# Patient Record
Sex: Female | Born: 2000
Health system: Southern US, Community
[De-identification: ages and names within clinical notes are randomized; demographics above are authoritative.]

## PROBLEM LIST (undated history)

## (undated) DIAGNOSIS — F32A Depression, unspecified: Secondary | ICD-10-CM

## (undated) DIAGNOSIS — S86899A Other injury of other muscle(s) and tendon(s) at lower leg level, unspecified leg, initial encounter: Secondary | ICD-10-CM

## (undated) DIAGNOSIS — U071 COVID-19: Secondary | ICD-10-CM

## (undated) DIAGNOSIS — F509 Eating disorder, unspecified: Secondary | ICD-10-CM

## (undated) DIAGNOSIS — H9190 Unspecified hearing loss, unspecified ear: Secondary | ICD-10-CM

## (undated) HISTORY — DX: Eating disorder, unspecified: F50.9

## (undated) HISTORY — DX: Depression, unspecified: F32.A

## (undated) HISTORY — DX: Unspecified hearing loss, unspecified ear: H91.90

## (undated) HISTORY — PX: OTHER SURGICAL HISTORY: SHX169

## (undated) HISTORY — PX: TYMPANOSTOMY TUBE PLACEMENT: SHX32

## (undated) HISTORY — DX: COVID-19: U07.1

## (undated) HISTORY — DX: Other injury of other muscle(s) and tendon(s) at lower leg level, unspecified leg, initial encounter: S86.899A

---
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
## 2020-03-18 DIAGNOSIS — M79662 Pain in left lower leg: Secondary | ICD-10-CM | POA: Diagnosis not present

## 2020-03-18 DIAGNOSIS — M79661 Pain in right lower leg: Secondary | ICD-10-CM | POA: Diagnosis not present

## 2020-04-18 DIAGNOSIS — M79661 Pain in right lower leg: Secondary | ICD-10-CM | POA: Diagnosis not present

## 2020-04-18 DIAGNOSIS — M79662 Pain in left lower leg: Secondary | ICD-10-CM | POA: Diagnosis not present

## 2020-04-24 ENCOUNTER — Ambulatory Visit
Admission: RE | Admit: 2020-04-24 | Discharge: 2020-04-24 | Disposition: A | Discharge status: Home / Self Care | Payer: 59 | Source: Ambulatory Visit | Attending: Internal Medicine | Admitting: Internal Medicine

## 2020-04-24 VITALS — BP 105/69 | HR 80 | Temp 99.2°F | Resp 14 | Ht 65.0 in | Wt 150.0 lb

## 2020-04-24 DIAGNOSIS — J0100 Acute maxillary sinusitis, unspecified: Secondary | ICD-10-CM | POA: Diagnosis not present

## 2020-04-24 MED ORDER — AMOXICILLIN-POT CLAVULANATE 875-125 MG PO TABS
1.0000 | ORAL_TABLET | Freq: Two times a day (BID) | ORAL | 0 refills | Status: DC
Start: 2020-04-24 — End: 2020-05-16

## 2020-04-24 NOTE — ED Provider Notes (Signed)
Renaldo Fiddler    CSN: [id number] Arrival date & time: 04/24/20  7741      History   Chief Complaint Chief Complaint  Patient presents with  . Cough  . Nasal Congestion  . Ear Fullness    HPI Jaclyn Reyes is a 19 y.o. female.   Patient is a 19 year old female who presents today with approximate 10 days of nasal congestion, cough.  Had a taken Sudafed.  No known Covid exposures has been vaccinated.  Low-grade fever today.     History reviewed. No pertinent past medical history.  There are no problems to display for this patient.   History reviewed. No pertinent surgical history.  OB History   No obstetric history on file.      Home Medications    Prior to Admission medications   Medication Sig Start Date End Date Taking? Authorizing Provider  amoxicillin-clavulanate (AUGMENTIN) 875-125 MG tablet Take 1 tablet by mouth every 12 (twelve) hours. 04/24/20   Dahlia Byes A, NP  pseudoephedrine (SUDAFED) 30 MG tablet Take 30 mg by mouth every 4 (four) hours as needed for congestion.    [provider]    Family History Family History  Problem Relation Age of Onset  . Healthy Mother   . Healthy Father     Social History Social History   Tobacco Use  . Smoking status: Never Smoker  . Smokeless tobacco: Never Used  Vaping Use  . Vaping Use: Never used  Substance Use Topics  . Alcohol use: Not Currently  . Drug use: Not Currently     Allergies   Patient has no known allergies.   Review of Systems Review of Systems   Physical Exam Triage Vital Signs ED Triage Vitals  Enc Vitals Group     BP 04/24/20 0928 105/69     Pulse Rate 04/24/20 0928 80     Resp 04/24/20 0928 14     Temp 04/24/20 0928 99.2 F (37.3 C)     Temp Source 04/24/20 0928 Oral     SpO2 04/24/20 0928 98 %     Weight 04/24/20 0924 150 lb (68 kg)     Height 04/24/20 0924 5\' 5"  (1.651 m)     Head Circumference --      Peak Flow --      Pain Score 04/24/20  0923 7     Pain Loc --      Pain Edu? --      Excl. in GC? --    No data found.  Updated Vital Signs BP 105/69 (BP Location: Left Arm)   Pulse 80   Temp 99.2 F (37.3 C) (Oral)   Resp 14   Ht 5\' 5"  (1.651 m)   Wt 150 lb (68 kg)   LMP 03/19/2020 (Exact Date)   SpO2 98%   BMI 24.96 kg/m   Visual Acuity Right Eye Distance:   Left Eye Distance:   Bilateral Distance:    Right Eye Near:   Left Eye Near:    Bilateral Near:     Physical Exam Vitals and nursing note reviewed.  Constitutional:      General: She is not in acute distress.    Appearance: Normal appearance. She is not ill-appearing, toxic-appearing or diaphoretic.  HENT:     Head: Normocephalic.     Right Ear: Tympanic membrane and ear canal normal.     Left Ear: Tympanic membrane and ear canal normal.     Nose: Congestion and  rhinorrhea present.     Mouth/Throat:     Pharynx: Oropharynx is clear. Posterior oropharyngeal erythema present.  Eyes:     Conjunctiva/sclera: Conjunctivae normal.  Cardiovascular:     Rate and Rhythm: Normal rate and regular rhythm.  Pulmonary:     Effort: Pulmonary effort is normal.     Breath sounds: Normal breath sounds.  Musculoskeletal:        General: Normal range of motion.     Cervical back: Normal range of motion.  Skin:    General: Skin is warm and dry.     Findings: No rash.  Neurological:     Mental Status: She is alert.  Psychiatric:        Mood and Affect: Mood normal.      UC Treatments / Results  Labs (all labs ordered are listed, but only abnormal results are displayed) Labs Reviewed - No data to display  EKG   Radiology No results found.  Procedures Procedures (including critical care time)  Medications Ordered in UC Medications - No data to display  Initial Impression / Assessment and Plan / UC Course  I have reviewed the triage vital signs and the nursing notes.  Pertinent labs & imaging results that were available during my care of the  patient were reviewed by me and considered in my medical decision making (see chart for details).     Sinusitis Patient with 10+ days of symptoms.  We will go and treat for bacterial sinusitis at this time. Treating with Augmentin.  She can continue the over-the-counter medicines as needed. Follow up as needed for continued or worsening symptoms  Final Clinical Impressions(s) / UC Diagnoses   Final diagnoses:  Acute non-recurrent maxillary sinusitis     Discharge Instructions     Treating you for a sinus infection.  Take the medication as prescribed.  You can continue the over-the-counter medicine as needed Follow up as needed for continued or worsening symptoms     ED Prescriptions    Medication Sig Dispense Auth. Provider   amoxicillin-clavulanate (AUGMENTIN) 875-125 MG tablet Take 1 tablet by mouth every 12 (twelve) hours. 14 tablet Jalin Alicea A, NP     PDMP not reviewed this encounter.   Janace Aris, NP 04/24/20 [phone]

## 2020-04-24 NOTE — ED Triage Notes (Signed)
Pt presents to Urgent Care with c/o nasal congestion and cough x approx 10 days. Pt w/ no known COVID exposure; has been vaccinated.

## 2020-04-24 NOTE — Discharge Instructions (Signed)
Treating you for a sinus infection.  Take the medication as prescribed.  You can continue the over-the-counter medicine as needed Follow up as needed for continued or worsening symptoms

## 2020-05-01 ENCOUNTER — Encounter: Payer: BLUE CROSS/BLUE SHIELD | Admitting: Family Medicine

## 2020-05-16 ENCOUNTER — Other Ambulatory Visit: Payer: Self-pay

## 2020-05-16 ENCOUNTER — Encounter: Payer: Self-pay | Admitting: Family Medicine

## 2020-05-16 ENCOUNTER — Other Ambulatory Visit (HOSPITAL_COMMUNITY)
Admission: RE | Admit: 2020-05-16 | Discharge: 2020-05-16 | Disposition: A | Discharge status: Home / Self Care | Payer: 59 | Source: Ambulatory Visit | Attending: Family Medicine | Admitting: Family Medicine

## 2020-05-16 ENCOUNTER — Ambulatory Visit (INDEPENDENT_AMBULATORY_CARE_PROVIDER_SITE_OTHER): Payer: 59 | Admitting: Family Medicine

## 2020-05-16 VITALS — BP 115/74 | HR 84 | Ht 65.0 in | Wt 167.0 lb

## 2020-05-16 DIAGNOSIS — Z01419 Encounter for gynecological examination (general) (routine) without abnormal findings: Secondary | ICD-10-CM | POA: Diagnosis not present

## 2020-05-16 DIAGNOSIS — Z113 Encounter for screening for infections with a predominantly sexual mode of transmission: Secondary | ICD-10-CM | POA: Insufficient documentation

## 2020-05-16 DIAGNOSIS — Z3041 Encounter for surveillance of contraceptive pills: Secondary | ICD-10-CM | POA: Diagnosis not present

## 2020-05-16 MED ORDER — NORGESTIMATE-ETH ESTRADIOL 0.25-35 MG-MCG PO TABS: 1.0000 | ORAL_TABLET | Freq: Every day | ORAL | 3 refills | Status: DC

## 2020-05-16 NOTE — Progress Notes (Signed)
Was on sprintec in past, stopped in Nov 2021, pediatrician refused to refill RX for her.

## 2020-05-16 NOTE — Progress Notes (Signed)
    Subjective:    Patient ID: Jaclyn Reyes is a 20 y.o. female presenting with Contraception  on 05/16/2020  HPI: Patient is here for OC renewal and annual exam. Plays soccer in college, she has been on Sprintec and would like to continue.  I have reviewed and updated patient's PMH, PSH, Soc Hx, FH, allergies, meds.  Review of Systems  Constitutional: Negative for chills and fever.  Respiratory: Negative for shortness of breath.   Cardiovascular: Negative for chest pain.  Gastrointestinal: Negative for abdominal pain, nausea and vomiting.  Genitourinary: Negative for dysuria.  Skin: Negative for rash.      Objective:    BP 115/74   Pulse 84   Ht 5\' 5"  (1.651 m)   Wt 167 lb (75.8 kg)   LMP 04/30/2020 (Exact Date)   BMI 27.79 kg/m  Physical Exam Constitutional:      General: She is not in acute distress.    Appearance: She is well-developed and well-nourished.  HENT:     Head: Normocephalic and atraumatic.  Eyes:     General: No scleral icterus. Cardiovascular:     Rate and Rhythm: Normal rate.  Pulmonary:     Effort: Pulmonary effort is normal.  Abdominal:     Palpations: Abdomen is soft.  Musculoskeletal:     Cervical back: Neck supple.  Skin:    General: Skin is warm and dry.  Neurological:     Mental Status: She is alert and oriented to person, place, and time.  Psychiatric:        Mood and Affect: Mood and affect normal.         Assessment & Plan:   Problem List Items Addressed This Visit   None   Visit Diagnoses    Encounter for surveillance of contraceptive pills    -  Primary   resume sprintec, sunday start after next menses   Relevant Medications   norgestimate-ethinyl estradiol (ORTHO-CYCLEN) 0.25-35 MG-MCG tablet   Screen for STD (sexually transmitted disease)       Relevant Orders   GC/Chlamydia probe amp ([place])not at [facility]   Encounter for gynecological examination without abnormal finding       safe sex practices        Return in about 1 year (around 05/16/2021).  07/14/2021 05/16/2020 10:57 AM

## 2020-05-17 LAB — GC/CHLAMYDIA PROBE AMP (~~LOC~~) NOT AT ARMC
Chlamydia: NEGATIVE
Comment: NEGATIVE
Comment: NORMAL
Neisseria Gonorrhea: NEGATIVE

## 2020-09-18 ENCOUNTER — Other Ambulatory Visit: Payer: Self-pay

## 2020-09-18 ENCOUNTER — Ambulatory Visit: Payer: 59 | Attending: Pediatrics

## 2020-09-18 DIAGNOSIS — M6281 Muscle weakness (generalized): Secondary | ICD-10-CM | POA: Insufficient documentation

## 2020-09-18 DIAGNOSIS — M79662 Pain in left lower leg: Secondary | ICD-10-CM | POA: Insufficient documentation

## 2020-09-18 DIAGNOSIS — M79661 Pain in right lower leg: Secondary | ICD-10-CM | POA: Insufficient documentation

## 2020-09-18 NOTE — Therapy (Signed)
[facility] MAIN [facility] SERVICES [address] [place], [place], [address] Phone: [phone]   Fax:  [phone]  Patient Details  Name: Jaclyn Reyes MRN: [id number] Date of Birth: 30-Sep-2000 Referring Provider:  Chrys Racer, MD  Encounter Date: 09/18/2020   PT/OT/SLP Screening Form   Time: in 12: 58    Time out 1: 41pm   Complaint: bilateral shin splints  Past Medical Hx: history of bilateral ankle injury, shin splints Injury Date:began in 2020.  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 Her pain started in fall of 2020 for her freshman year of soccer but was minor, it than re-occurred in fall of 2021 and became problematic limiting her ability to play, walk, and perform physical activities. She has seen her athletic trainer for it as well as doctor but has not had relief. Is on summer break at this time but will start training in mid summer.   Worst pain 10/10 Least amount of pain 3-4/10  Assessment:  Ankle: excessive inversion bilaterally, hypombile eversion bilaterally with R>L. WFL df/pf  Knee: WFL ROM however has some "bowing in" indicating adductor weakness and need for single limb stability    Ankle ROM Seated: Motion: AROM seated Right Left  Plantarflexion 4+ 5  Dorsiflexion 4 4  Inversion 4 4  Eversion 4 4   Accessory motions: Talocrural: hypomobile AP bilaterally Medial/lateral glide: hypomobile   Soft tissue:  Limited Sartorious and gastrocnemius tissue length bilaterally  Tenderness to medial aspect of tibial border bilaterally    Recommendations:    Comments: Patient presents with bilateral shin splints. Her pain is limiting her return to sport at this time and will benefit from skilled physical therapy. A return to running protocol given as well as daily stretching program. Patient demonstrated understanding of HEP.   Additional education on cross training in swimming pool provided.  Patient taped with K tape, educated on taping technique and demonstrated understanding. The following HEP is provided below.   [email or web address]-content/uploads/[email or web address]/[email or web address]-Splints.pdf  []  Patient would benefit from an MD referral [x]  Patient would benefit from a full PT/OT/ SLP evaluation and treatment. []  No intervention recommended at this time.   , PT, DPT    09/18/2020, 3:42 PM  [facility] MAIN [facility] SERVICES [address] [place], [facility], [address] Phone: [phone]   Fax:  [phone]

## 2020-09-21 ENCOUNTER — Telehealth: Payer: Self-pay | Admitting: Family Medicine

## 2020-09-21 DIAGNOSIS — S86899A Other injury of other muscle(s) and tendon(s) at lower leg level, unspecified leg, initial encounter: Secondary | ICD-10-CM

## 2020-09-21 NOTE — Telephone Encounter (Signed)
Sports injury--refer to PT

## 2020-09-21 NOTE — Telephone Encounter (Signed)
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 You have also seen this patient, she needs a referral for PT for shin splints [facility] - outpatient rehab) patient plays college soccer and needs to has some PT) before returning to school. phone number for [facility] - outpt rehab is [phone]. Patient does not have PCP at this time.

## 2020-10-02 ENCOUNTER — Ambulatory Visit (LOCAL_COMMUNITY_HEALTH_CENTER): Payer: Self-pay

## 2020-10-02 ENCOUNTER — Other Ambulatory Visit: Payer: Self-pay

## 2020-10-02 DIAGNOSIS — Z111 Encounter for screening for respiratory tuberculosis: Secondary | ICD-10-CM

## 2020-10-04 ENCOUNTER — Other Ambulatory Visit: Payer: Self-pay

## 2020-10-04 ENCOUNTER — Ambulatory Visit: Payer: 59

## 2020-10-04 DIAGNOSIS — M79661 Pain in right lower leg: Secondary | ICD-10-CM

## 2020-10-04 DIAGNOSIS — M79662 Pain in left lower leg: Secondary | ICD-10-CM | POA: Diagnosis present

## 2020-10-04 DIAGNOSIS — M6281 Muscle weakness (generalized): Secondary | ICD-10-CM | POA: Diagnosis present

## 2020-10-04 NOTE — Therapy (Signed)
[place] [facility] MAIN [facility] SERVICES [address] [place], [place], [address] Phone: [phone]   Fax:  [phone]  Physical Therapy Evaluation  Patient Details  Name: Jaclyn Reyes MRN: [id number] Date of Birth: 2001-01-19 Referring Provider (PT): Tinnie Gens   Encounter Date: 10/04/2020   PT End of Session - 10/04/20 0836    Visit Number 1    Number of Visits 12    Date for PT Re-Evaluation 12/27/20    Authorization Type 1/10 eval 5/25    PT Start Time 0717    PT Stop Time 0813    PT Time Calculation (min) 56 min    Activity Tolerance Patient tolerated treatment well;Patient limited by pain    Behavior During Therapy [facility] for tasks assessed/performed           History reviewed. No pertinent past medical history.  Past Surgical History:  Procedure Laterality Date  . TYMPANOSTOMY TUBE PLACEMENT      There were no vitals filed for this visit.    Subjective Assessment - 10/04/20 0720    Subjective Patient is a pleasant 20 year old female who presents for shin splints.    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 Her pain started in fall of 2020 for her freshman year of soccer but was minor, it than re-occurred in fall of 2021 and became problematic limiting her ability to play, walk, and perform physical activities. She has seen her athletic trainer for it as well as doctor but has not had relief. Is on summer break at this time but will start training in mid summer    How long can you sit comfortably? bothers her in weightbearing and nonweightbearing, can't cross legs more than 5 minutes; hurts to have feet down for too long.    How long can you stand comfortably? can stand longer than sitting    How long can you walk comfortably? 20 minutes    Patient Stated Goals to decrease pain and return to soccer.    Currently in Pain? Yes    Pain Score 2     Pain Location Tibia    Pain Orientation Right;Left     Pain Descriptors / Indicators Aching;Pressure;Radiating    Pain Type Chronic pain    Pain Onset More than a month ago    Pain Frequency Constant    Aggravating Factors  soccer, first thing in the morning    Pain Relieving Factors n/a    Effect of Pain on Daily Activities limits soccer, painful with daily exercise              [facility] PT Assessment - 10/04/20 0831      Assessment   Medical Diagnosis medial tibial stress syndrome    Referring Provider (PT) Tinnie Gens    Onset Date/Surgical Date --   fall 2020   Prior Therapy yes from [facility] for this issue, and PT for ankles      Precautions   Precautions None      Restrictions   Weight Bearing Restrictions No      Balance Screen   Has the patient fallen in the past 6 months --   socer falls   Has the patient had a decrease in activity level because of a fear of falling?  Yes    Is the patient reluctant to leave their home because of a fear of falling?  No      Home Environment  Living Environment Private residence    Living Arrangements Parent   for summer   Available Help at Discharge Family      Prior Function   Level of Independence Independent    Vocation Student    Vocation Requirements plays soccer at school, waits tables, also in CNA course      Cognition   Overall Cognitive Status Within Functional Limits for tasks assessed      Observation/Other Assessments   Focus on Therapeutic Outcomes (FOTO)  67%              . SUBJECTIVE Chief complaint: bilateral shin pain in lateral aspect, numbness of lateral aspect of bilateral legs History: Her pain started in fall of 2020 for her freshman year of soccer but was minor, it than re-occurred in fall of 2021 and became problematic limiting her ability to play, walk, and perform physical activities. She has seen her athletic trainer for it as well as doctor but has not had relief. Is on summer break at this time but will start training in mid summer Referring  HQ:[id number] shin splints Referring Provider: Tinnie Gens  Pain location: bilateral shins  Pain: Present 2/10, Best 1/10, Worst 10 /10 Pain quality: pain quality: vague Radiating pain: Yes knees to feet Numbness/Tingling: Yes lateral aspect of leg to hip from foot; tingling on lateral aspect of calf and into feet.  24 hour pain behavior: worse after practice, can't walk up stairs  Aggravating factors: sitting, first getting up after bed, after playing soccer Easing factors: after warmed up nothing seems to help  History of back, hip, or knee pain: Yes ankles but not knees  Precautions/WB Restrictions: No Next MD Visit:  Follow-up appointment with MD: No Imaging: No  Occupational demands: is a soccer player at college, in class currently for CNA Hobbies: soccer, hang out with friends Goals: no pain with soccer Typical footwear: is aware of pain and shoe choice.  Yes   OBJECTIVE  MUSCULOSKELETAL: Tremor: Absent Bulk: Normal Tone: Normal, no clonus No trophic changes noted to foot/ankle. No ecchymosis, erythema, or edema noted. No gross ankle/foot deformity noted  Lumbar/Hip/Knee Screen SLR: negative Scour test: positive bilaterally All pelvic tests: negative FAIR: positive bilaterally Meniscal testing: +for L knee medial meniscus; negative R knee ACL, PCL, MCL, LCL testing negative Spinal: UPA CPA does not radiate pain Nerve glide: negative for pain but does relieve stiffness  Posture Excessive internal rotation of bilateral hips   Gait Pronation with ambulation; with internal hip rotation and knee "knocking"  Palpation Pain to bilateral shins  Strength R/L 5/5 Hip flexion 4/4 Hip external rotation 4/4 Hip internal rotation 4+/4+ Hip extension  5/4+ Hip abduction 4/4- Hip adduction 4/4 Knee extension: VMO 4/5;  5/5 Knee flexion 4+/5 Ankle Plantarflexion 4/4 Ankle Dorsiflexion 4+/4 Ankle Inversion 4/4 Ankle Eversion Full ROM for   AROM R/L 35/40 Ankle  Plantarflexion 10/5 Ankle Dorsiflexion 28/31 Ankle Inversion 24/25 Ankle Eversion *Indicates Pain  PROM R/L 40/42 Ankle Plantarflexion 12/6 Ankle Dorsiflexion 31/34 Ankle Inversion 24/23 Ankle Eversion *Indicates Pain  Passive Accessory Motion Superior Tibiofibular Joint: hypomobile bilateral ; painful Inferior Tibiofibular Joint: hypomobile painful  Talocrural Joint Distraction: hypomobile Talocrural Joint AP: hypomobile Talocrural Joint PA: hard end feel  NEUROLOGICAL:  Mental Status Patient is oriented to person, place and time.  Recent memory is intact.  Remote memory is intact.  Attention span and concentration are intact.  Expressive speech is intact.  Patient's fund of knowledge is within normal limits for  educational level.  Sensation Grossly intact to light touch bilateral LEs as determined by testing dermatomes L2-S2; lateral aspect of bilateral thighs, shins and feet impaired  Proprioception and hot/cold testing deferred on this date    SPECIAL TESTS  Ligamentous Integrity Anterior Drawer (ATF, 10-15 plantarflexion with anterior translation): Positive for history of sprain  Talar Tilt (CFL, inversion): Negative Eversion Stress Test (Deltoid, eversion): Positive for weakness and hx of strain External Rotation Test (High ankle, dorsiflexion and external rotation): Positive for pain in sin Squeeze Test (High ankle): Positive for pain in aspect of shin rather than calf  Impingment Sign (Dorsiflexion and eversion): Positive  Achilles Integrity Thompson Test: Negative  Fracture Screening Metatarsal Axial Loading: Negative Tap/Percussion Test: Negative Vibration Test: Positive pain in shin   Pronation/Supination Navicular Drop: Positive  Nerve Test Tarsal Tunnel Test (maximal DF, EV, toe ext with tapping over tarsal tunnel): Negative Test for Morton's Neuroma (compress metatarsals and mobilize): Not done  Other Windlass Mechanism Test: Not  done  Special functional test: airex pad: -static stand: limited ankle righting reactions with high episodic rocking -NBOS: increase challenge to ankle righting reactions: slight pain increase -NBOS with eyes closed: significant rocking and limited stabilization -modified tandem stance: no pain increase but slight pull   Squat:  Bilateral hip and knee internal rotation with foot collapse, cued for flexion of big toe and pushing out knees assists in neutralization of body mechanics  Lunge: Hip and knee excessive internal rotation: cue for flexion of big toe and abduction of knee assists in neutralization of body mechanics  SLS: 30 seconds bilaterally but increased trembling with LLE  ASSESSMENT Pt is a pleasant year-old 20 year old female referred for bilateral shin splints/medial stress syndrome.  PT examination reveals deficits in ankle strength and mobility as well as hip and knee instability. She has excessive hip and knee internal rotation as well as dropped arches bilaterally. Excessively tight posterior calves limit her functional range of motion. Her lateral calf and thighs have bilateral limited sensation that will additionally need to be addressed for patient function. Education on cross training in pool and need for stretching performed with patient verbalizing understanding. Due to her high level of function will benefit from 1x/week at this time however this is subject to change as patient nears season start. Pt will benefit from PT services to address deficits in strength, mobility, and pain in order to return to full function with activities and return to sport.      Access Code: [email or web address] URL: [email or web address] Date: 10/03/2020 Prepared by: Precious Bard  Exercises  . Standing Single Leg Ball Rolls - Forward Backward - 1 x daily - 7 x weekly - 2 sets - 10 reps - 5 hold . Standing Single Leg Ball Rolls - Side to Side - 1 x daily - 7 x weekly - 2 sets - 10  reps - 5 hold . Standing Soccer Ball Taps - 1 x daily - 7 x weekly - 2 sets - 10 reps - 5 hold . Seated Ankle Alphabet - 1 x daily - 7 x weekly - 2 sets - 10 reps - 5 hold . Soleus Stretch on Wall - 1 x daily - 7 x weekly - 2 sets - 2 reps - 30 hold . Gastroc Stretch on Wall - 1 x daily - 7 x weekly - 2 sets - 2 reps - 30 hold . Sidelying Hip Adduction - 1 x daily - 7 x weekly - 2 sets -  10 reps - 5 hold . Supine Bridge with Mini Swiss Ball Between Knees - 1 x daily - 7 x weekly - 2 sets - 10 reps - 5 hold    Objective measurements completed on examination: See above findings.               PT Education - 10/04/20 0836    Education Details HEP, body mechanics, goals    Person(s) Educated Patient    Methods Explanation;Demonstration;Tactile cues;Verbal cues;Handout    Comprehension Verbalized understanding;Returned demonstration;Verbal cues required;Tactile cues required            PT Short Term Goals - 10/04/20 0839      PT SHORT TERM GOAL #1   Title Patient will be independent in home exercise program to improve strength/mobility for better functional independence with ADLs.    Baseline 5/25: HEP given    Time 6    Period Weeks    Status New    Target Date 11/15/20             PT Long Term Goals - 10/04/20 0840      PT LONG TERM GOAL #1   Title Patient will increase FOTO score to equal to or greater than 82%    to demonstrate statistically significant improvement in mobility and quality of life.    Baseline 5/25: 62%    Time 12    Period Weeks    Status New    Target Date 12/27/20      PT LONG TERM GOAL #2   Title Patient will return to playing soccer with pain increase of less than 3/10 for return to sport.    Baseline 5/25: unable to play without 10/10 pain    Time 12    Period Weeks    Status New    Target Date 12/27/20      PT LONG TERM GOAL #3   Title Patient will increase BLE gross strength to 5/5 as to improve functional strength for  independent gait, increased standing tolerance and increased ADL ability.    Baseline 5/25: see note    Time 12    Period Weeks    Status New    Target Date 12/27/20      PT LONG TERM GOAL #4   Title Patient will squat and lunge with nuetral body mechanics without excessive internal rotation indicating improved alignment of LE's as well as strength.    Baseline 5/25: internal hip and knee collapse    Time 12    Period Weeks    Status New    Target Date 12/27/20      PT LONG TERM GOAL #5   Title Patient will report a worst pain of 3/10 on VAS in bilateral calves  to improve tolerance with ADLs and reduced symptoms with activities.    Baseline 5/25: 10/10    Time 12    Period Weeks    Status New    Target Date 12/27/20                  Plan - 10/04/20 0837    Clinical Impression Statement Pt is a pleasant year-old 20 year old female referred for bilateral shin splints/medial stress syndrome.  PT examination reveals deficits in ankle strength and mobility as well as hip and knee instability. She has excessive hip and knee internal rotation as well as dropped arches bilaterally. Excessively tight posterior calves limit her functional range of motion. Her lateral calf and  thighs have bilateral limited sensation that will additionally need to be addressed for patient function. Education on cross training in pool and need for stretching performed with patient verbalizing understanding. Due to her high level of function will benefit from 1x/week at this time however this is subject to change as patient nears season start. Pt will benefit from PT services to address deficits in strength, mobility, and pain in order to return to full function with activities and return to sport.    Personal Factors and Comorbidities Comorbidity 1;Past/Current Experience;Time since onset of injury/illness/exacerbation    Comorbidities history of ankle instability    Examination-Activity Limitations  Carry;Sleep;Sit;Locomotion Level;Lift;Squat;Stairs;Stand;Transfers    Examination-Participation Restrictions Cleaning;Community Activity;Driving;Occupation;Meal Prep;Laundry;School;Volunteer;Yard Work;Other    Stability/Clinical Decision Making Stable/Uncomplicated    Clinical Decision Making Low    Rehab Potential Good    PT Frequency 1x / week    PT Duration 12 weeks    PT Treatment/Interventions ADLs/Self Care Home Management;Aquatic Therapy;Biofeedback;Cryotherapy;Electrical Stimulation;Iontophoresis 4mg /ml Dexamethasone;Moist Heat;Ultrasound;Traction;Gait training;Stair training;Functional mobility training;Neuromuscular re-education;Balance training;Therapeutic exercise;Therapeutic activities;Patient/family education;Manual techniques;Dry needling;Passive range of motion;Scar mobilization;Compression bandaging;Energy conservation;Splinting;Taping;Visual/perceptual remediation/compensation;Spinal Manipulations;Joint Manipulations    PT Next Visit Plan single leg stability, calf restriction reduction, hip rotation    PT Home Exercise Plan see above    Consulted and Agree with Plan of Care Patient           Patient will benefit from skilled therapeutic intervention in order to improve the following deficits and impairments:  Abnormal gait,Decreased balance,Decreased coordination,Decreased mobility,Decreased range of motion,Difficulty walking,Decreased strength,Hypomobility,Increased edema,Impaired flexibility,Increased muscle spasms,Impaired sensation,Postural dysfunction,Improper body mechanics,Pain  Visit Diagnosis: Pain in left lower leg  Pain in right lower leg  Muscle weakness (generalized)     Problem List There are no problems to display for this patient.  , PT, DPT   10/04/2020, 8:44 AM  [place] [facility] MAIN [facility] SERVICES [address] [place], [place], [place] Phone: [phone]   Fax:  [phone]  Name: AUBRIELLA PEREZGARCIA MRN: [id number] Date of Birth: 07-13-2000

## 2020-10-04 NOTE — Patient Instructions (Signed)
  Access Code: [email or web address] URL: [email or web address] Date: 10/03/2020 Prepared by: Precious Bard  Exercises  . Standing Single Leg Ball Rolls - Forward Backward - 1 x daily - 7 x weekly - 2 sets - 10 reps - 5 hold . Standing Single Leg Ball Rolls - Side to Side - 1 x daily - 7 x weekly - 2 sets - 10 reps - 5 hold . Standing Soccer Ball Taps - 1 x daily - 7 x weekly - 2 sets - 10 reps - 5 hold . Seated Ankle Alphabet - 1 x daily - 7 x weekly - 2 sets - 10 reps - 5 hold . Soleus Stretch on Wall - 1 x daily - 7 x weekly - 2 sets - 2 reps - 30 hold . Gastroc Stretch on Wall - 1 x daily - 7 x weekly - 2 sets - 2 reps - 30 hold . Sidelying Hip Adduction - 1 x daily - 7 x weekly - 2 sets - 10 reps - 5 hold . Supine Bridge with Mini Swiss Ball Between Knees - 1 x daily - 7 x weekly - 2 sets - 10 reps - 5 hold

## 2020-10-05 ENCOUNTER — Ambulatory Visit (LOCAL_COMMUNITY_HEALTH_CENTER): Payer: 59

## 2020-10-05 ENCOUNTER — Other Ambulatory Visit: Payer: Self-pay

## 2020-10-05 DIAGNOSIS — Z111 Encounter for screening for respiratory tuberculosis: Secondary | ICD-10-CM

## 2020-10-05 DIAGNOSIS — Z3041 Encounter for surveillance of contraceptive pills: Secondary | ICD-10-CM

## 2020-10-05 LAB — TB SKIN TEST
Induration: 0 mm
TB Skin Test: NEGATIVE

## 2020-10-05 MED ORDER — NORGESTIMATE-ETH ESTRADIOL 0.25-35 MG-MCG PO TABS: 1.0000 | ORAL_TABLET | Freq: Every day | ORAL | 3 refills | Status: DC

## 2020-10-11 ENCOUNTER — Ambulatory Visit: Payer: 59 | Attending: Family Medicine

## 2020-10-11 ENCOUNTER — Other Ambulatory Visit: Payer: Self-pay

## 2020-10-11 DIAGNOSIS — M79661 Pain in right lower leg: Secondary | ICD-10-CM | POA: Insufficient documentation

## 2020-10-11 DIAGNOSIS — M79662 Pain in left lower leg: Secondary | ICD-10-CM | POA: Diagnosis present

## 2020-10-11 DIAGNOSIS — M6281 Muscle weakness (generalized): Secondary | ICD-10-CM | POA: Insufficient documentation

## 2020-10-11 NOTE — Therapy (Signed)
[facility] MAIN [facility] SERVICES [address] [place], [place], [address] Phone: [phone]   Fax:  [phone]  Physical Therapy Treatment  Patient Details  Name: Jaclyn Reyes MRN: [id number] Date of Birth: [birth year] Referring Provider (PT): Tinnie Gens   Encounter Date: 10/11/2020   PT End of Session - 10/11/20 0920    Visit Number 2    Number of Visits 12    Date for PT Re-Evaluation 12/27/20    Authorization Type 2/10 eval 5/25    PT Start Time 0717    PT Stop Time 0800    PT Time Calculation (min) 43 min    Activity Tolerance Patient tolerated treatment well    Behavior During Therapy [facility] for tasks assessed/performed           History reviewed. No pertinent past medical history.  Past Surgical History:  Procedure Laterality Date  . TYMPANOSTOMY TUBE PLACEMENT      There were no vitals filed for this visit.   Subjective Assessment - 10/11/20 0918    Subjective Patient reports her knee hurt when doing drills with her brother. No pain in shins though.    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 Her pain started in fall of 2020 for her freshman year of soccer but was minor, it than re-occurred in fall of 2021 and became problematic limiting her ability to play, walk, and perform physical activities. She has seen her athletic trainer for it as well as doctor but has not had relief. Is on summer break at this time but will start training in mid summer    How long can you sit comfortably? bothers her in weightbearing and nonweightbearing, can't cross legs more than 5 minutes; hurts to have feet down for too long.    How long can you stand comfortably? can stand longer than sitting    How long can you walk comfortably? 20 minutes    Patient Stated Goals to decrease pain and return to soccer.    Currently in Pain? No/denies               Manual Long sitting: heat applied to bilateral  calves Progressive dorsiflexion with focus on gastric and soleus lengthening 2x30 seconds each LE  Talocrural AP and PA mobilizations grade II-III x4 minutes Talocrural distraction 4x 20 second holds  Therex: On table:  Sidelying:  Side plank with dips 30 seconds each side   Quadruped:  Fire hydrants 15x each LE cues for gluteal activation rather than compensatory hip flexor contraction Planks 2x 30 seconds cues for neutral foot alignment   Seated: 4 way ankle resistance GTB each LE 10x each direction in long sitting Prostretch df/pf 20x Single limb sit to stand from raised plinth table pressing into PT hand for abduction 10x each LE dynadisc df/pf 10x each LE Dynadisc: clockwise circles 10x each LE; very challenging for patient  Standing: Against table: -modified single limb bridge with pressing into PT hand into abduction 10x each LE -modified pushup with opposite LE in extension 10x each LE.  Hedgehog taps single limb stability 8x each LE    Pt educated throughout session about proper posture and technique with exercises. Improved exercise technique, movement at target joints, use of target muscles after min to mod verbal, visual, tactile cues.  Patient is highly motivated throughout physical therapy session. Diffuse ankle instability noted bilaterally and will be a primary focus in today and future sessions. Single  limb stability is challenging for patient and patient requires external stimuli for stabilization. Pt will benefit from PT services to address deficits in strength, mobility, and pain in order to return to full function with activities and return to sport.                    PT Education - 10/11/20 0919    Education Details body mechanics, sequencing    Person(s) Educated Patient    Methods Explanation;Demonstration;Tactile cues;Verbal cues    Comprehension Verbalized understanding;Verbal cues required;Tactile cues required;Returned demonstration             PT Short Term Goals - 10/04/20 0839      PT SHORT TERM GOAL #1   Title Patient will be independent in home exercise program to improve strength/mobility for better functional independence with ADLs.    Baseline 5/25: HEP given    Time 6    Period Weeks    Status New    Target Date 11/15/20             PT Long Term Goals - 10/04/20 0840      PT LONG TERM GOAL #1   Title Patient will increase FOTO score to equal to or greater than 82%    to demonstrate statistically significant improvement in mobility and quality of life.    Baseline 5/25: 62%    Time 12    Period Weeks    Status New    Target Date 12/27/20      PT LONG TERM GOAL #2   Title Patient will return to playing soccer with pain increase of less than 3/10 for return to sport.    Baseline 5/25: unable to play without 10/10 pain    Time 12    Period Weeks    Status New    Target Date 12/27/20      PT LONG TERM GOAL #3   Title Patient will increase BLE gross strength to 5/5 as to improve functional strength for independent gait, increased standing tolerance and increased ADL ability.    Baseline 5/25: see note    Time 12    Period Weeks    Status New    Target Date 12/27/20      PT LONG TERM GOAL #4   Title Patient will squat and lunge with nuetral body mechanics without excessive internal rotation indicating improved alignment of LE's as well as strength.    Baseline 5/25: internal hip and knee collapse    Time 12    Period Weeks    Status New    Target Date 12/27/20      PT LONG TERM GOAL #5   Title Patient will report a worst pain of 3/10 on VAS in bilateral calves  to improve tolerance with ADLs and reduced symptoms with activities.    Baseline 5/25: 10/10    Time 12    Period Weeks    Status New    Target Date 12/27/20                 Plan - 10/11/20 6301    Clinical Impression Statement Patient is highly motivated throughout physical therapy session. Diffuse ankle instability  noted bilaterally and will be a primary focus in today and future sessions. Single limb stability is challenging for patient and patient requires external stimuli for stabilization. Pt will benefit from PT services to address deficits in strength, mobility, and pain in order to return to full function  with activities and return to sport.    Personal Factors and Comorbidities Comorbidity 1;Past/Current Experience;Time since onset of injury/illness/exacerbation    Comorbidities history of ankle instability    Examination-Activity Limitations Carry;Sleep;Sit;Locomotion Level;Lift;Squat;Stairs;Stand;Transfers    Examination-Participation Restrictions Cleaning;Community Activity;Driving;Occupation;Meal Prep;Laundry;School;Volunteer;Yard Work;Other    Stability/Clinical Decision Making Stable/Uncomplicated    Rehab Potential Good    PT Frequency 1x / week    PT Duration 12 weeks    PT Treatment/Interventions ADLs/Self Care Home Management;Aquatic Therapy;Biofeedback;Cryotherapy;Electrical Stimulation;Iontophoresis 4mg /ml Dexamethasone;Moist Heat;Ultrasound;Traction;Gait training;Stair training;Functional mobility training;Neuromuscular re-education;Balance training;Therapeutic exercise;Therapeutic activities;Patient/family education;Manual techniques;Dry needling;Passive range of motion;Scar mobilization;Compression bandaging;Energy conservation;Splinting;Taping;Visual/perceptual remediation/compensation;Spinal Manipulations;Joint Manipulations    PT Next Visit Plan single leg stability, calf restriction reduction, hip rotation    PT Home Exercise Plan see above    Consulted and Agree with Plan of Care Patient           Patient will benefit from skilled therapeutic intervention in order to improve the following deficits and impairments:  Abnormal gait,Decreased balance,Decreased coordination,Decreased mobility,Decreased range of motion,Difficulty walking,Decreased strength,Hypomobility,Increased  edema,Impaired flexibility,Increased muscle spasms,Impaired sensation,Postural dysfunction,Improper body mechanics,Pain  Visit Diagnosis: Pain in left lower leg  Pain in right lower leg  Muscle weakness (generalized)     Problem List There are no problems to display for this patient.  , PT, DPT   10/11/2020, 9:22 AM  [place] [facility] MAIN [facility] SERVICES [address] [place], [place], [place] Phone: [phone]   Fax:  [phone]  Name: Jaclyn Reyes MRN: Ebbie Latus Date of Birth: [birth year]

## 2020-10-17 ENCOUNTER — Ambulatory Visit: Payer: 59 | Admitting: Physical Therapy

## 2020-10-18 ENCOUNTER — Other Ambulatory Visit: Payer: Self-pay

## 2020-10-18 ENCOUNTER — Ambulatory Visit: Payer: 59

## 2020-10-18 DIAGNOSIS — M79662 Pain in left lower leg: Secondary | ICD-10-CM

## 2020-10-18 DIAGNOSIS — M79661 Pain in right lower leg: Secondary | ICD-10-CM

## 2020-10-18 DIAGNOSIS — M6281 Muscle weakness (generalized): Secondary | ICD-10-CM

## 2020-10-18 NOTE — Therapy (Signed)
[facility] MAIN [facility] SERVICES [address] [place], [place], [address] Phone: [phone]   Fax:  [phone]  Physical Therapy Treatment  Patient Details  Name: Jaclyn Reyes MRN: [id number] Date of Birth: 02/18/2001 Referring Provider (PT): Tinnie Gens   Encounter Date: 10/18/2020   PT End of Session - 10/18/20 0740    Visit Number 3    Number of Visits 12    Date for PT Re-Evaluation 12/27/20    Authorization Type 3/10 eval 5/25    PT Start Time 0717    PT Stop Time 0759    PT Time Calculation (min) 42 min    Activity Tolerance Patient tolerated treatment well    Behavior During Therapy [facility] for tasks assessed/performed           History reviewed. No pertinent past medical history.  Past Surgical History:  Procedure Laterality Date  . TYMPANOSTOMY TUBE PLACEMENT      There were no vitals filed for this visit.   Subjective Assessment - 10/18/20 0735    Subjective Patient flew to [place] over the weekend. Is having increased numbness after flight.    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 Her pain started in fall of 2020 for her freshman year of soccer but was minor, it than re-occurred in fall of 2021 and became problematic limiting her ability to play, walk, and perform physical activities. She has seen her athletic trainer for it as well as doctor but has not had relief. Is on summer break at this time but will start training in mid summer    How long can you sit comfortably? bothers her in weightbearing and nonweightbearing, can't cross legs more than 5 minutes; hurts to have feet down for too long.    How long can you stand comfortably? can stand longer than sitting    How long can you walk comfortably? 20 minutes    Patient Stated Goals to decrease pain and return to soccer.    Currently in Pain? Yes    Pain Score 7     Pain Location Tibia    Pain Orientation Right;Left    Pain Descriptors  / Indicators Aching;Squeezing;Pressure    Pain Type Chronic pain    Pain Onset More than a month ago    Pain Frequency Constant                   Manual supine: heat applied to bilateral calves Progressive dorsiflexion with focus on gastric and soleus lengthening 2x30 seconds each LE  Talocrural AP and PA mobilizations grade II-III x4 minutes Talocrural distraction 4x 20 second holds  Prone: STM with metal tool for tissue correction bilateral calves x4 minutes  Therex:   Superset: x3 sets Exercise 1: Three way LAQ: inv, neutral, eversion 10x each direction; with 3lb ankle weight  Exercise 2: lateral modified jump/skiers with focus on stabilization and sequencing  Superset 2: x2 sets  Airex pad single limb stance throwing ball to PT x10 Posterior lunges 10x each LE     Pt educated throughout session about proper posture and technique with exercises. Improved exercise technique, movement at target joints, use of target muscles after min to mod verbal, visual, tactile cues.   Patient tolerates progressive strengthening and stabilization interventions. She is able to perform light kinesthetic movements and lateral jumping without pain increase. L gastroc is more impaired than R with diffuse scar tissue noted near achilles region.  Dynamic pertubation's will continue to be an area for progression for patient.  Pt will benefit from PT services to address deficits in strength, mobility, and pain in order to return to full function with activities and return to sport                    PT Education - 10/18/20 0739    Education Details body mechanics, sequencing    Person(s) Educated Patient    Methods Explanation;Demonstration;Tactile cues;Verbal cues    Comprehension Verbalized understanding;Returned demonstration;Verbal cues required;Tactile cues required            PT Short Term Goals - 10/04/20 0839      PT SHORT TERM GOAL #1   Title Patient will be  independent in home exercise program to improve strength/mobility for better functional independence with ADLs.    Baseline 5/25: HEP given    Time 6    Period Weeks    Status New    Target Date 11/15/20             PT Long Term Goals - 10/04/20 0840      PT LONG TERM GOAL #1   Title Patient will increase FOTO score to equal to or greater than 82%    to demonstrate statistically significant improvement in mobility and quality of life.    Baseline 5/25: 62%    Time 12    Period Weeks    Status New    Target Date 12/27/20      PT LONG TERM GOAL #2   Title Patient will return to playing soccer with pain increase of less than 3/10 for return to sport.    Baseline 5/25: unable to play without 10/10 pain    Time 12    Period Weeks    Status New    Target Date 12/27/20      PT LONG TERM GOAL #3   Title Patient will increase BLE gross strength to 5/5 as to improve functional strength for independent gait, increased standing tolerance and increased ADL ability.    Baseline 5/25: see note    Time 12    Period Weeks    Status New    Target Date 12/27/20      PT LONG TERM GOAL #4   Title Patient will squat and lunge with nuetral body mechanics without excessive internal rotation indicating improved alignment of LE's as well as strength.    Baseline 5/25: internal hip and knee collapse    Time 12    Period Weeks    Status New    Target Date 12/27/20      PT LONG TERM GOAL #5   Title Patient will report a worst pain of 3/10 on VAS in bilateral calves  to improve tolerance with ADLs and reduced symptoms with activities.    Baseline 5/25: 10/10    Time 12    Period Weeks    Status New    Target Date 12/27/20                 Plan - 10/18/20 0748    Clinical Impression Statement Patient tolerates progressive strengthening and stabilization interventions. She is able to perform light kinesthetic movements and lateral jumping without pain increase. L gastroc is more  impaired than R with diffuse scar tissue noted near achilles region. Dynamic pertubation's will continue to be an area for progression for patient.  Pt will benefit from PT services to address deficits in  strength, mobility, and pain in order to return to full function with activities and return to sport    Personal Factors and Comorbidities Comorbidity 1;Past/Current Experience;Time since onset of injury/illness/exacerbation    Comorbidities history of ankle instability    Examination-Activity Limitations Carry;Sleep;Sit;Locomotion Level;Lift;Squat;Stairs;Stand;Transfers    Examination-Participation Restrictions Cleaning;Community Activity;Driving;Occupation;Meal Prep;Laundry;School;Volunteer;Yard Work;Other    Stability/Clinical Decision Making Stable/Uncomplicated    Rehab Potential Good    PT Frequency 1x / week    PT Duration 12 weeks    PT Treatment/Interventions ADLs/Self Care Home Management;Aquatic Therapy;Biofeedback;Cryotherapy;Electrical Stimulation;Iontophoresis 4mg /ml Dexamethasone;Moist Heat;Ultrasound;Traction;Gait training;Stair training;Functional mobility training;Neuromuscular re-education;Balance training;Therapeutic exercise;Therapeutic activities;Patient/family education;Manual techniques;Dry needling;Passive range of motion;Scar mobilization;Compression bandaging;Energy conservation;Splinting;Taping;Visual/perceptual remediation/compensation;Spinal Manipulations;Joint Manipulations    PT Next Visit Plan single leg stability, calf restriction reduction, hip rotation    PT Home Exercise Plan see above    Consulted and Agree with Plan of Care Patient           Patient will benefit from skilled therapeutic intervention in order to improve the following deficits and impairments:  Abnormal gait,Decreased balance,Decreased coordination,Decreased mobility,Decreased range of motion,Difficulty walking,Decreased strength,Hypomobility,Increased edema,Impaired flexibility,Increased  muscle spasms,Impaired sensation,Postural dysfunction,Improper body mechanics,Pain  Visit Diagnosis: Pain in left lower leg  Pain in right lower leg  Muscle weakness (generalized)     Problem List There are no problems to display for this patient.  , PT, DPT   10/18/2020, 8:00 AM  [place] [facility] MAIN [facility] SERVICES [address] [place], [place], [place] Phone: [phone]   Fax:  [phone]  Name: Jaclyn Reyes MRN: Ebbie Latus Date of Birth: [birth year]

## 2020-10-19 ENCOUNTER — Encounter: Payer: 59 | Admitting: Physical Therapy

## 2020-10-24 ENCOUNTER — Encounter: Payer: 59 | Admitting: Physical Therapy

## 2020-10-25 ENCOUNTER — Other Ambulatory Visit: Payer: Self-pay

## 2020-10-25 ENCOUNTER — Ambulatory Visit: Payer: 59

## 2020-10-25 DIAGNOSIS — M79662 Pain in left lower leg: Secondary | ICD-10-CM | POA: Diagnosis not present

## 2020-10-25 DIAGNOSIS — M79661 Pain in right lower leg: Secondary | ICD-10-CM

## 2020-10-25 DIAGNOSIS — M6281 Muscle weakness (generalized): Secondary | ICD-10-CM

## 2020-10-25 NOTE — Therapy (Signed)
[place] [facility] MAIN [facility] SERVICES [address] [place], [place], [address] Phone: [phone]   Fax:  [phone]  Physical Therapy Treatment  Patient Details  Name: Jaclyn Reyes MRN: [id number] Date of Birth: Apr 08, 2001 Referring Provider (PT): Tinnie Gens   Encounter Date: 10/25/2020   PT End of Session - 10/25/20 0738     Visit Number 4    Number of Visits 12    Date for PT Re-Evaluation 12/27/20    Authorization Type 4/10 eval 5/25    PT Start Time 0715    PT Stop Time 0759    PT Time Calculation (min) 44 min    Activity Tolerance Patient tolerated treatment well    Behavior During Therapy [facility] for tasks assessed/performed             History reviewed. No pertinent past medical history.  Past Surgical History:  Procedure Laterality Date   TYMPANOSTOMY TUBE PLACEMENT      There were no vitals filed for this visit.   Subjective Assessment - 10/25/20 0734     Subjective Patient reports she has been having discomfort after exercise since last visit. No pain after swimming though.    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 Her pain started in fall of 2020 for her freshman year of soccer but was minor, it than re-occurred in fall of 2021 and became problematic limiting her ability to play, walk, and perform physical activities. She has seen her athletic trainer for it as well as doctor but has not had relief. Is on summer break at this time but will start training in mid summer    How long can you sit comfortably? bothers her in weightbearing and nonweightbearing, can't cross legs more than 5 minutes; hurts to have feet down for too long.    How long can you stand comfortably? can stand longer than sitting    How long can you walk comfortably? 20 minutes    Patient Stated Goals to decrease pain and return to soccer.    Currently in Pain? Yes    Pain Score 6     Pain Location Tibia    Pain  Orientation Right;Left    Pain Descriptors / Indicators Discomfort    Pain Type Chronic pain    Pain Onset More than a month ago    Pain Frequency Intermittent                      Manual supine: heat applied to bilateral calves Progressive dorsiflexion with focus on gastric and soleus lengthening 2x30 seconds each LE  Talocrural AP and PA mobilizations grade II-III x4 minutes Talocrural distraction 4x 20 second holds  Prone: STM with metal tool for tissue correction bilateral calves x4 minutes   Therex:  great toe abduction ; very challenging for patient requires assistance for task performance Tilt board df stretch hold 30 seconds  Tilt board anterior/posterior shift 10x each LE placement  Superset:1 x3 sets Exercise 1: single limb squat 10x from raised plinth table ; cues to not hyperextend upon standing and to maintain outward direction of knee Exercise 2: monster walks BTB around ankles 20 ft forward/backwards    Superset 2: x2 sets Airex pad single limb stance three way hip raise 10x each LE Single leg heel raise 25 x each LE    Pt educated throughout session about proper posture and technique with exercises. Improved exercise technique, movement at  target joints, use of target muscles after min to mod verbal, visual, tactile cues.     Access Code: [email or web address] URL: [email or web address] Date: 10/25/2020 Prepared by: Precious Bard  Exercises Standing 3-Way Leg Reach with Resistance at Ankles and Counter Support - 1 x daily - 7 x weekly - 2 sets - 10 reps - 5 hold Single Leg Heel Raise with Counter Support - 1 x daily - 7 x weekly - 2 sets - 25 reps - 5 hold Toe Spreading - 1 x daily - 7 x weekly - 2 sets - 10 reps - 5 hold              PT Education - 10/25/20 0738     Education Details body mechanics, stabilization, not pushing past pain    Person(s) Educated Patient    Methods Explanation;Demonstration;Tactile cues;Verbal cues     Comprehension Verbalized understanding;Returned demonstration;Verbal cues required;Tactile cues required              PT Short Term Goals - 10/04/20 0839       PT SHORT TERM GOAL #1   Title Patient will be independent in home exercise program to improve strength/mobility for better functional independence with ADLs.    Baseline 5/25: HEP given    Time 6    Period Weeks    Status New    Target Date 11/15/20               PT Long Term Goals - 10/04/20 0840       PT LONG TERM GOAL #1   Title Patient will increase FOTO score to equal to or greater than 82%    to demonstrate statistically significant improvement in mobility and quality of life.    Baseline 5/25: 62%    Time 12    Period Weeks    Status New    Target Date 12/27/20      PT LONG TERM GOAL #2   Title Patient will return to playing soccer with pain increase of less than 3/10 for return to sport.    Baseline 5/25: unable to play without 10/10 pain    Time 12    Period Weeks    Status New    Target Date 12/27/20      PT LONG TERM GOAL #3   Title Patient will increase BLE gross strength to 5/5 as to improve functional strength for independent gait, increased standing tolerance and increased ADL ability.    Baseline 5/25: see note    Time 12    Period Weeks    Status New    Target Date 12/27/20      PT LONG TERM GOAL #4   Title Patient will squat and lunge with nuetral body mechanics without excessive internal rotation indicating improved alignment of LE's as well as strength.    Baseline 5/25: internal hip and knee collapse    Time 12    Period Weeks    Status New    Target Date 12/27/20      PT LONG TERM GOAL #5   Title Patient will report a worst pain of 3/10 on VAS in bilateral calves  to improve tolerance with ADLs and reduced symptoms with activities.    Baseline 5/25: 10/10    Time 12    Period Weeks    Status New    Target Date 12/27/20                   Plan -  10/25/20 0814      Clinical Impression Statement Patient tolerated progressive single limb stance interventions. Her calf activation continues to be limited as well as ankle righting reactions on stable and unstable surfaces indicating area for continued focus as well as adding HEP for task performance. Patient is unable to actively abduct toes requiring AAROM for task performance. Educated on importance of foot intrinsic muscle strengthening for ankle stabilization and pain reduction with patient verbalizing understanding. Pt will benefit from PT services to address deficits in strength, mobility, and pain in order to return to full function with activities and return to sport    Personal Factors and Comorbidities Comorbidity 1;Past/Current Experience;Time since onset of injury/illness/exacerbation    Comorbidities history of ankle instability    Examination-Activity Limitations Carry;Sleep;Sit;Locomotion Level;Lift;Squat;Stairs;Stand;Transfers    Examination-Participation Restrictions Cleaning;Community Activity;Driving;Occupation;Meal Prep;Laundry;School;Volunteer;Yard Work;Other    Stability/Clinical Decision Making Stable/Uncomplicated    Rehab Potential Good    PT Frequency 1x / week    PT Duration 12 weeks    PT Treatment/Interventions ADLs/Self Care Home Management;Aquatic Therapy;Biofeedback;Cryotherapy;Electrical Stimulation;Iontophoresis 4mg /ml Dexamethasone;Moist Heat;Ultrasound;Traction;Gait training;Stair training;Functional mobility training;Neuromuscular re-education;Balance training;Therapeutic exercise;Therapeutic activities;Patient/family education;Manual techniques;Dry needling;Passive range of motion;Scar mobilization;Compression bandaging;Energy conservation;Splinting;Taping;Visual/perceptual remediation/compensation;Spinal Manipulations;Joint Manipulations    PT Next Visit Plan single leg stability, calf restriction reduction, hip rotation    PT Home Exercise Plan see above    Consulted and  Agree with Plan of Care Patient             Patient will benefit from skilled therapeutic intervention in order to improve the following deficits and impairments:  Abnormal gait, Decreased balance, Decreased coordination, Decreased mobility, Decreased range of motion, Difficulty walking, Decreased strength, Hypomobility, Increased edema, Impaired flexibility, Increased muscle spasms, Impaired sensation, Postural dysfunction, Improper body mechanics, Pain  Visit Diagnosis: Pain in left lower leg  Pain in right lower leg  Muscle weakness (generalized)     Problem List There are no problems to display for this patient.  , PT, DPT  10/25/2020, 8:16 AM  [place] [facility] MAIN [facility] SERVICES [address] [place], [place], [place] Phone: [phone]   Fax:  [phone]  Name: Jaclyn Reyes MRN: Ebbie Latus Date of Birth: 04/26/2001

## 2020-10-26 ENCOUNTER — Encounter: Payer: 59 | Admitting: Physical Therapy

## 2020-10-31 ENCOUNTER — Encounter: Payer: 59 | Admitting: Physical Therapy

## 2020-11-01 ENCOUNTER — Ambulatory Visit: Payer: 59

## 2020-11-01 ENCOUNTER — Other Ambulatory Visit: Payer: Self-pay

## 2020-11-01 DIAGNOSIS — M79662 Pain in left lower leg: Secondary | ICD-10-CM | POA: Diagnosis not present

## 2020-11-01 DIAGNOSIS — M79661 Pain in right lower leg: Secondary | ICD-10-CM

## 2020-11-01 DIAGNOSIS — M6281 Muscle weakness (generalized): Secondary | ICD-10-CM

## 2020-11-01 NOTE — Therapy (Signed)
[facility] MAIN [facility] SERVICES [address] [place], [place], [address] Phone: [phone]   Fax:  [phone]  Physical Therapy Treatment  Patient Details  Name: Jaclyn Reyes MRN: [id number] Date of Birth: [birth year] Referring Provider (PT): Tinnie Gens   Encounter Date: 11/01/2020   PT End of Session - 11/01/20 1230     Visit Number 5    Number of Visits 12    Date for PT Re-Evaluation 12/27/20    Authorization Type 5/10 eval 5/25    PT Start Time 1020    PT Stop Time 1102    PT Time Calculation (min) 42 min    Activity Tolerance Patient tolerated treatment well    Behavior During Therapy [facility] for tasks assessed/performed             History reviewed. No pertinent past medical history.  Past Surgical History:  Procedure Laterality Date   TYMPANOSTOMY TUBE PLACEMENT      There were no vitals filed for this visit.   Subjective Assessment - 11/01/20 1227     Subjective Patient reports compliance with HEP, has not had any pain. Has been compliant with running program.    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 Her pain started in fall of 2020 for her freshman year of soccer but was minor, it than re-occurred in fall of 2021 and became problematic limiting her ability to play, walk, and perform physical activities. She has seen her athletic trainer for it as well as doctor but has not had relief. Is on summer break at this time but will start training in mid summer    How long can you sit comfortably? bothers her in weightbearing and nonweightbearing, can't cross legs more than 5 minutes; hurts to have feet down for too long.    How long can you stand comfortably? can stand longer than sitting    How long can you walk comfortably? 20 minutes    Patient Stated Goals to decrease pain and return to soccer.    Currently in Pain? No/denies                    Manual supine: heat applied to  bilateral calves Progressive dorsiflexion with focus on gastric and soleus lengthening 2x30 seconds each LE  Talocrural AP and PA mobilizations grade II-III x4 minutes Talocrural distraction 4x 20 second holds  Prone: STM with metal tool for tissue correction bilateral calves x4 minutes  Half kneeling: Mobilization with anterior to posterior glide 5x 20 seconds each LE ; patient reports relief of symptoms   TherEx Superset:  3 sets Exercise 1: 6" step up/down 15x each LE; cues for knee abduction for neutral alignment Exercise 2: trampoline jump  20x ; cue for neutral alignment   Superset 2:  2 sets Exercise 1: bent knee calf raise 15x Exercise 2: single leg soelous bridge 15x; very challenging   Marble pick up and transfer 10 marbles; 2 sets each LE Great toe extension AAROM 10x each side Great toe abduction AAROM 10x each side  standing prostretch 60 seconds    Patient's vitals monitored throughout the session.  Pt educated throughout session about proper posture and technique with exercises. Improved exercise technique, movement at target joints, use of target muscles after min to mod verbal, visual, tactile cues  Patient tolerated progressive stabilization interventions. She is highly motivated throughout physical therapy session. Decreased need for external cueing for knee positioning  for majority of exercises except step up's. She continues to be challenged with great toe activation requiring AAROM at this time. Pt will benefit from PT services to address deficits in strength, mobility, and pain in order to return to full function with activities and return to sport                  PT Education - 11/01/20 1229     Education Details body mechanics stabilization    Person(s) Educated Patient    Methods Explanation;Demonstration;Tactile cues;Verbal cues    Comprehension Verbalized understanding;Returned demonstration;Verbal cues required;Tactile cues required               PT Short Term Goals - 10/04/20 0839       PT SHORT TERM GOAL #1   Title Patient will be independent in home exercise program to improve strength/mobility for better functional independence with ADLs.    Baseline 5/25: HEP given    Time 6    Period Weeks    Status New    Target Date 11/15/20               PT Long Term Goals - 10/04/20 0840       PT LONG TERM GOAL #1   Title Patient will increase FOTO score to equal to or greater than 82%    to demonstrate statistically significant improvement in mobility and quality of life.    Baseline 5/25: 62%    Time 12    Period Weeks    Status New    Target Date 12/27/20      PT LONG TERM GOAL #2   Title Patient will return to playing soccer with pain increase of less than 3/10 for return to sport.    Baseline 5/25: unable to play without 10/10 pain    Time 12    Period Weeks    Status New    Target Date 12/27/20      PT LONG TERM GOAL #3   Title Patient will increase BLE gross strength to 5/5 as to improve functional strength for independent gait, increased standing tolerance and increased ADL ability.    Baseline 5/25: see note    Time 12    Period Weeks    Status New    Target Date 12/27/20      PT LONG TERM GOAL #4   Title Patient will squat and lunge with nuetral body mechanics without excessive internal rotation indicating improved alignment of LE's as well as strength.    Baseline 5/25: internal hip and knee collapse    Time 12    Period Weeks    Status New    Target Date 12/27/20      PT LONG TERM GOAL #5   Title Patient will report a worst pain of 3/10 on VAS in bilateral calves  to improve tolerance with ADLs and reduced symptoms with activities.    Baseline 5/25: 10/10    Time 12    Period Weeks    Status New    Target Date 12/27/20                   Plan - 11/01/20 1235     Clinical Impression Statement Patient tolerated progressive stabilization interventions. She is  highly motivated throughout physical therapy session. Decreased need for external cueing for knee positioning for majority of exercises except step up's. She continues to be challenged with great toe activation requiring AAROM at this time. Pt  will benefit from PT services to address deficits in strength, mobility, and pain in order to return to full function with activities and return to sport    Personal Factors and Comorbidities Comorbidity 1;Past/Current Experience;Time since onset of injury/illness/exacerbation    Comorbidities history of ankle instability    Examination-Activity Limitations Carry;Sleep;Sit;Locomotion Level;Lift;Squat;Stairs;Stand;Transfers    Examination-Participation Restrictions Cleaning;Community Activity;Driving;Occupation;Meal Prep;Laundry;School;Volunteer;Yard Work;Other    Stability/Clinical Decision Making Stable/Uncomplicated    Rehab Potential Good    PT Frequency 1x / week    PT Duration 12 weeks    PT Treatment/Interventions ADLs/Self Care Home Management;Aquatic Therapy;Biofeedback;Cryotherapy;Electrical Stimulation;Iontophoresis 4mg /ml Dexamethasone;Moist Heat;Ultrasound;Traction;Gait training;Stair training;Functional mobility training;Neuromuscular re-education;Balance training;Therapeutic exercise;Therapeutic activities;Patient/family education;Manual techniques;Dry needling;Passive range of motion;Scar mobilization;Compression bandaging;Energy conservation;Splinting;Taping;Visual/perceptual remediation/compensation;Spinal Manipulations;Joint Manipulations    PT Next Visit Plan single leg stability, calf restriction reduction, hip rotation    PT Home Exercise Plan see above    Consulted and Agree with Plan of Care Patient             Patient will benefit from skilled therapeutic intervention in order to improve the following deficits and impairments:  Abnormal gait, Decreased balance, Decreased coordination, Decreased mobility, Decreased range of motion,  Difficulty walking, Decreased strength, Hypomobility, Increased edema, Impaired flexibility, Increased muscle spasms, Impaired sensation, Postural dysfunction, Improper body mechanics, Pain  Visit Diagnosis: Pain in left lower leg  Pain in right lower leg  Muscle weakness (generalized)     Problem List There are no problems to display for this patient.   , PT, DPT  11/01/2020, 12:37 PM  [place] [facility] MAIN [facility] SERVICES [address] [place], [place], [place] Phone: [phone]   Fax:  [phone]  Name: Jaclyn Reyes MRN: Ebbie Latus Date of Birth: 08-21-2000

## 2020-11-02 ENCOUNTER — Encounter: Payer: 59 | Admitting: Physical Therapy

## 2020-11-07 ENCOUNTER — Encounter: Payer: 59 | Admitting: Physical Therapy

## 2020-11-08 ENCOUNTER — Other Ambulatory Visit: Payer: Self-pay

## 2020-11-08 ENCOUNTER — Ambulatory Visit: Payer: 59

## 2020-11-08 DIAGNOSIS — M79662 Pain in left lower leg: Secondary | ICD-10-CM | POA: Diagnosis not present

## 2020-11-08 DIAGNOSIS — M6281 Muscle weakness (generalized): Secondary | ICD-10-CM

## 2020-11-08 DIAGNOSIS — M79661 Pain in right lower leg: Secondary | ICD-10-CM

## 2020-11-08 NOTE — Therapy (Signed)
[facility] MAIN [facility] SERVICES [address] [place], [place], [address] Phone: [phone]   Fax:  [phone]  Physical Therapy Treatment  Patient Details  Name: Jaclyn Reyes MRN: [id number] Date of Birth: Dec 01, 2000 Referring Provider (PT): Tinnie Gens   Encounter Date: 11/08/2020   PT End of Session - 11/08/20 1132     Visit Number 6    Number of Visits 12    Date for PT Re-Evaluation 12/27/20    Authorization Type 6/10 eval 5/25    PT Start Time 0714    PT Stop Time 0759    PT Time Calculation (min) 45 min    Activity Tolerance Patient tolerated treatment well    Behavior During Therapy [facility] for tasks assessed/performed             History reviewed. No pertinent past medical history.  Past Surgical History:  Procedure Laterality Date   TYMPANOSTOMY TUBE PLACEMENT      There were no vitals filed for this visit.   Subjective Assessment - 11/08/20 1109     Subjective Patient reports tightness in anterior aspect of L ankle radiating mid shin. No pain in shins, has returned from weekend trip to [place].    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 Her pain started in fall of 2020 for her freshman year of soccer but was minor, it than re-occurred in fall of 2021 and became problematic limiting her ability to play, walk, and perform physical activities. She has seen her athletic trainer for it as well as doctor but has not had relief. Is on summer break at this time but will start training in mid summer    How long can you sit comfortably? bothers her in weightbearing and nonweightbearing, can't cross legs more than 5 minutes; hurts to have feet down for too long.    How long can you stand comfortably? can stand longer than sitting    How long can you walk comfortably? 20 minutes    Patient Stated Goals to decrease pain and return to soccer.    Currently in Pain? No/denies                Manual Talocrural AP and PA mobilizations grade II-III x4 minutes STM to L ATFL insertion x6 minutes with implementation of effleurage and ptrissage  Trigger Point Dry Needling (TDN), unbilled Education performed with patient regarding potential benefit of TDN. Reviewed precautions and risks with patient. Reviewed special precautions/risks over lung fields which include pneumothorax. Reviewed signs and symptoms of pneumothorax and advised pt to go to ER immediately if these symptoms develop advise them of dry needling treatment. Extensive time spent with pt to ensure full understanding of TDN risks. Pt provided verbal consent to treatment. TDN performed to  with 0.25 x 40 single needle placements with local twitch response (LTR). Pistoning technique utilized. Improved pain-free motion following intervention to bilateral ATFLs x 3 minutes     Half kneeling: Mobilization with anterior to posterior glide 10x 10 seconds each LE ; patient reports relief of symptoms    TherEx Superset:  3 sets Exercise 1: sissy squat (sitting on knees with feet plantarflexed, return to tall kneeling and back to starting position) 10x Exercise 2: single leg RDL passing dumbbell from side to side 10x each LE     Pre squat training protocol:performed with PT giving correcting cues: -hip extension with focus on lengthening hip flexor and squeezing/activating gluteal 10x  each LE -hip abduction/cross body adduction swing with focus on glute med activation and lengthening of adductors 10x each LE -standing ankle circles 10x clockwise, 10x counterclockwise each LE  -hip opening technique on floor ; 10x each side     Pt educated throughout session about proper posture and technique with exercises. Improved exercise technique, movement at target joints, use of target muscles after min to mod verbal, visual, tactile cues  Patient educated and performed pre squat training protocol. Additional education on need for  cross training once season begins performed with patient verbalizing understanding. TDN performed with patient reporting relief of tightness by end of session. Manual decreased discomfort and allowed patient optimal alignment. Single leg stability continues to be area of improvement for patient at this time. Pt will benefit from PT services to address deficits in strength, mobility, and pain in order to return to full function with activities and return to sport                      PT Education - 11/08/20 1131     Education Details pre squat routine    Person(s) Educated Patient    Methods Explanation;Demonstration;Tactile cues;Verbal cues    Comprehension Verbalized understanding;Returned demonstration;Verbal cues required;Tactile cues required              PT Short Term Goals - 10/04/20 0839       PT SHORT TERM GOAL #1   Title Patient will be independent in home exercise program to improve strength/mobility for better functional independence with ADLs.    Baseline 5/25: HEP given    Time 6    Period Weeks    Status New    Target Date 11/15/20               PT Long Term Goals - 10/04/20 0840       PT LONG TERM GOAL #1   Title Patient will increase FOTO score to equal to or greater than 82%    to demonstrate statistically significant improvement in mobility and quality of life.    Baseline 5/25: 62%    Time 12    Period Weeks    Status New    Target Date 12/27/20      PT LONG TERM GOAL #2   Title Patient will return to playing soccer with pain increase of less than 3/10 for return to sport.    Baseline 5/25: unable to play without 10/10 pain    Time 12    Period Weeks    Status New    Target Date 12/27/20      PT LONG TERM GOAL #3   Title Patient will increase BLE gross strength to 5/5 as to improve functional strength for independent gait, increased standing tolerance and increased ADL ability.    Baseline 5/25: see note    Time 12     Period Weeks    Status New    Target Date 12/27/20      PT LONG TERM GOAL #4   Title Patient will squat and lunge with nuetral body mechanics without excessive internal rotation indicating improved alignment of LE's as well as strength.    Baseline 5/25: internal hip and knee collapse    Time 12    Period Weeks    Status New    Target Date 12/27/20      PT LONG TERM GOAL #5   Title Patient will report a worst pain of 3/10 on  VAS in bilateral calves  to improve tolerance with ADLs and reduced symptoms with activities.    Baseline 5/25: 10/10    Time 12    Period Weeks    Status New    Target Date 12/27/20                   Plan - 11/08/20 1134     Clinical Impression Statement Patient educated and performed pre squat training protocol. Additional education on need for cross training once season begins performed with patient verbalizing understanding. TDN performed with patient reporting relief of tightness by end of session. Manual decreased discomfort and allowed patient optimal alignment. Single leg stability continues to be area of improvement for patient at this time. Pt will benefit from PT services to address deficits in strength, mobility, and pain in order to return to full function with activities and return to sport    Personal Factors and Comorbidities Comorbidity 1;Past/Current Experience;Time since onset of injury/illness/exacerbation    Comorbidities history of ankle instability    Examination-Activity Limitations Carry;Sleep;Sit;Locomotion Level;Lift;Squat;Stairs;Stand;Transfers    Examination-Participation Restrictions Cleaning;Community Activity;Driving;Occupation;Meal Prep;Laundry;School;Volunteer;Yard Work;Other    Stability/Clinical Decision Making Stable/Uncomplicated    Rehab Potential Good    PT Frequency 1x / week    PT Duration 12 weeks    PT Treatment/Interventions ADLs/Self Care Home Management;Aquatic Therapy;Biofeedback;Cryotherapy;Electrical  Stimulation;Iontophoresis 4mg /ml Dexamethasone;Moist Heat;Ultrasound;Traction;Gait training;Stair training;Functional mobility training;Neuromuscular re-education;Balance training;Therapeutic exercise;Therapeutic activities;Patient/family education;Manual techniques;Dry needling;Passive range of motion;Scar mobilization;Compression bandaging;Energy conservation;Splinting;Taping;Visual/perceptual remediation/compensation;Spinal Manipulations;Joint Manipulations    PT Next Visit Plan single leg stability, calf restriction reduction, hip rotation    PT Home Exercise Plan see above    Consulted and Agree with Plan of Care Patient             Patient will benefit from skilled therapeutic intervention in order to improve the following deficits and impairments:  Abnormal gait, Decreased balance, Decreased coordination, Decreased mobility, Decreased range of motion, Difficulty walking, Decreased strength, Hypomobility, Increased edema, Impaired flexibility, Increased muscle spasms, Impaired sensation, Postural dysfunction, Improper body mechanics, Pain  Visit Diagnosis: Pain in left lower leg  Pain in right lower leg  Muscle weakness (generalized)     Problem List There are no problems to display for this patient.  , PT, DPT  11/08/2020, 11:36 AM  [facility] MAIN [facility] SERVICES [address] [place], [place], [place] Phone: [phone]   Fax:  [phone]  Name: ABBAGAYLE ZARAGOZA MRN: Ebbie Latus Date of Birth: 2000/11/28

## 2020-11-09 ENCOUNTER — Encounter: Payer: 59 | Admitting: Physical Therapy

## 2020-11-15 ENCOUNTER — Other Ambulatory Visit: Payer: Self-pay

## 2020-11-15 ENCOUNTER — Ambulatory Visit: Payer: 59 | Attending: Family Medicine

## 2020-11-15 DIAGNOSIS — M79662 Pain in left lower leg: Secondary | ICD-10-CM | POA: Insufficient documentation

## 2020-11-15 DIAGNOSIS — M6281 Muscle weakness (generalized): Secondary | ICD-10-CM | POA: Diagnosis present

## 2020-11-15 DIAGNOSIS — M79661 Pain in right lower leg: Secondary | ICD-10-CM | POA: Diagnosis present

## 2020-11-15 NOTE — Therapy (Signed)
[place] [facility] MAIN [facility] SERVICES [address] [place], [place], [address] Phone: [phone]   Fax:  [phone]  Physical Therapy Treatment  Patient Details  Name: Jaclyn Reyes MRN: [id number] Date of Birth: [birth year] Referring Provider (PT): Tinnie Gens   Encounter Date: 11/15/2020   PT End of Session - 11/15/20 0802     Visit Number 7    Number of Visits 12    Date for PT Re-Evaluation 12/27/20    Authorization Type 7/10 eval 5/25    PT Start Time 0716    PT Stop Time 0800    PT Time Calculation (min) 44 min    Activity Tolerance Patient tolerated treatment well    Behavior During Therapy [facility] for tasks assessed/performed             History reviewed. No pertinent past medical history.  Past Surgical History:  Procedure Laterality Date   TYMPANOSTOMY TUBE PLACEMENT      There were no vitals filed for this visit.   Subjective Assessment - 11/15/20 0742     Subjective Patient reports the interventions performed last session helped with the tightness and discomfort. Continues to have discomfort in anterior aspect of shins with soccer but no pain.    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 Her pain started in fall of 2020 for her freshman year of soccer but was minor, it than re-occurred in fall of 2021 and became problematic limiting her ability to play, walk, and perform physical activities. She has seen her athletic trainer for it as well as doctor but has not had relief. Is on summer break at this time but will start training in mid summer    How long can you sit comfortably? bothers her in weightbearing and nonweightbearing, can't cross legs more than 5 minutes; hurts to have feet down for too long.    How long can you stand comfortably? can stand longer than sitting    How long can you walk comfortably? 20 minutes    Patient Stated Goals to decrease pain and return to soccer.    Currently in  Pain? No/denies                   Manual Talocrural AP and PA mobilizations grade II-III x4 minutes STM to L ATFL insertion x9 minutes with implementation of effleurage and ptrissage   Half kneeling: Mobilization with anterior to posterior glide 10x 10 seconds each LE ; patient reports relief of symptoms   Trigger Point Dry Needling (TDN), unbilled Education performed with patient regarding potential benefit of TDN. Reviewed precautions and risks with patient. Reviewed special precautions/risks over lung fields which include pneumothorax. Reviewed signs and symptoms of pneumothorax and advised pt to go to ER immediately if these symptoms develop advise them of dry needling treatment. Extensive time spent with pt to ensure full understanding of TDN risks. Pt provided verbal consent to treatment. TDN performed to  with 0.25 x 40 single needle placements with local twitch response (LTR). Pistoning technique utilized. Improved pain-free motion following intervention to bilateral ATFLs x 3 minutes      TherEx Superset:  3 sets Exercise 1: sissy squat (sitting on knees with feet plantarflexed, return to tall kneeling and back to starting position) 10x Exercise 2: lateral jump onto/off of bosu ball 10x each side   BAPS board: clockwise circles 10x , counterclockwise 10x ; each LE; 2 sets  Pt educated throughout session about proper posture and technique with exercises. Improved exercise technique, movement at target joints, use of target muscles after min to mod verbal, visual, tactile cues    Patient is improving with decreased irritation of anterior aspect of shins. Continued focus on proper muscle tissue length in combination of strengthening surrounding musculature performed. She is highly motivated for progression and will return to season/school next month. Pt will benefit from PT services to address deficits in strength, mobility, and pain in order to return to full  function with activities and return to sport                 PT Education - 11/15/20 0802     Education Details exercise technique, TDN,    Person(s) Educated Patient    Methods Explanation;Demonstration;Tactile cues;Verbal cues    Comprehension Verbalized understanding;Returned demonstration;Verbal cues required;Tactile cues required              PT Short Term Goals - 10/04/20 0839       PT SHORT TERM GOAL #1   Title Patient will be independent in home exercise program to improve strength/mobility for better functional independence with ADLs.    Baseline 5/25: HEP given    Time 6    Period Weeks    Status New    Target Date 11/15/20               PT Long Term Goals - 10/04/20 0840       PT LONG TERM GOAL #1   Title Patient will increase FOTO score to equal to or greater than 82%    to demonstrate statistically significant improvement in mobility and quality of life.    Baseline 5/25: 62%    Time 12    Period Weeks    Status New    Target Date 12/27/20      PT LONG TERM GOAL #2   Title Patient will return to playing soccer with pain increase of less than 3/10 for return to sport.    Baseline 5/25: unable to play without 10/10 pain    Time 12    Period Weeks    Status New    Target Date 12/27/20      PT LONG TERM GOAL #3   Title Patient will increase BLE gross strength to 5/5 as to improve functional strength for independent gait, increased standing tolerance and increased ADL ability.    Baseline 5/25: see note    Time 12    Period Weeks    Status New    Target Date 12/27/20      PT LONG TERM GOAL #4   Title Patient will squat and lunge with nuetral body mechanics without excessive internal rotation indicating improved alignment of LE's as well as strength.    Baseline 5/25: internal hip and knee collapse    Time 12    Period Weeks    Status New    Target Date 12/27/20      PT LONG TERM GOAL #5   Title Patient will report a worst pain  of 3/10 on VAS in bilateral calves  to improve tolerance with ADLs and reduced symptoms with activities.    Baseline 5/25: 10/10    Time 12    Period Weeks    Status New    Target Date 12/27/20                   Plan - 11/15/20 4403  Clinical Impression Statement Patient is improving with decreased irritation of anterior aspect of shins. Continued focus on proper muscle tissue length in combination of strengthening surrounding musculature performed. She is highly motivated for progression and will return to season/school next month. Pt will benefit from PT services to address deficits in strength, mobility, and pain in order to return to full function with activities and return to sport    Personal Factors and Comorbidities Comorbidity 1;Past/Current Experience;Time since onset of injury/illness/exacerbation    Comorbidities history of ankle instability    Examination-Activity Limitations Carry;Sleep;Sit;Locomotion Level;Lift;Squat;Stairs;Stand;Transfers    Examination-Participation Restrictions Cleaning;Community Activity;Driving;Occupation;Meal Prep;Laundry;School;Volunteer;Yard Work;Other    Stability/Clinical Decision Making Stable/Uncomplicated    Rehab Potential Good    PT Frequency 1x / week    PT Duration 12 weeks    PT Treatment/Interventions ADLs/Self Care Home Management;Aquatic Therapy;Biofeedback;Cryotherapy;Electrical Stimulation;Iontophoresis 4mg /ml Dexamethasone;Moist Heat;Ultrasound;Traction;Gait training;Stair training;Functional mobility training;Neuromuscular re-education;Balance training;Therapeutic exercise;Therapeutic activities;Patient/family education;Manual techniques;Dry needling;Passive range of motion;Scar mobilization;Compression bandaging;Energy conservation;Splinting;Taping;Visual/perceptual remediation/compensation;Spinal Manipulations;Joint Manipulations    PT Next Visit Plan single leg stability, calf restriction reduction, hip rotation    PT Home  Exercise Plan see above    Consulted and Agree with Plan of Care Patient             Patient will benefit from skilled therapeutic intervention in order to improve the following deficits and impairments:  Abnormal gait, Decreased balance, Decreased coordination, Decreased mobility, Decreased range of motion, Difficulty walking, Decreased strength, Hypomobility, Increased edema, Impaired flexibility, Increased muscle spasms, Impaired sensation, Postural dysfunction, Improper body mechanics, Pain  Visit Diagnosis: Pain in left lower leg  Pain in right lower leg  Muscle weakness (generalized)     Problem List There are no problems to display for this patient.   , PT, DPT  11/15/2020, 8:07 AM  [facility] MAIN [facility] SERVICES [address] [place], [place], [place] Phone: [phone]   Fax:  [phone]  Name: Jaclyn Reyes MRN: Ebbie Latus Date of Birth: [birth year]

## 2020-11-22 ENCOUNTER — Ambulatory Visit: Payer: 59

## 2020-11-22 ENCOUNTER — Other Ambulatory Visit: Payer: Self-pay

## 2020-11-22 DIAGNOSIS — M79662 Pain in left lower leg: Secondary | ICD-10-CM

## 2020-11-22 DIAGNOSIS — M6281 Muscle weakness (generalized): Secondary | ICD-10-CM

## 2020-11-22 DIAGNOSIS — M79661 Pain in right lower leg: Secondary | ICD-10-CM

## 2020-11-22 NOTE — Therapy (Signed)
[facility] MAIN [facility] SERVICES [address] [place], [place], [address] Phone: [phone]   Fax:  [phone]  Physical Therapy Treatment  Patient Details  Name: Jaclyn Reyes MRN: [id number] Date of Birth: Jun 08, 2000 Referring Provider (PT): Tinnie Gens   Encounter Date: 11/22/2020   PT End of Session - 11/22/20 0804     Visit Number 8    Number of Visits 12    Date for PT Re-Evaluation 12/27/20    Authorization Type 8/10 eval 5/25    PT Start Time 0716    PT Stop Time 0758    PT Time Calculation (min) 42 min    Activity Tolerance Patient tolerated treatment well    Behavior During Therapy [facility] for tasks assessed/performed             History reviewed. No pertinent past medical history.  Past Surgical History:  Procedure Laterality Date   TYMPANOSTOMY TUBE PLACEMENT      There were no vitals filed for this visit.   Subjective Assessment - 11/22/20 0803     Subjective Patient reports she ran without any pain since last session. Reports the dry needling and manual have been helping.    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 Her pain started in fall of 2020 for her freshman year of soccer but was minor, it than re-occurred in fall of 2021 and became problematic limiting her ability to play, walk, and perform physical activities. She has seen her athletic trainer for it as well as doctor but has not had relief. Is on summer break at this time but will start training in mid summer    How long can you sit comfortably? bothers her in weightbearing and nonweightbearing, can't cross legs more than 5 minutes; hurts to have feet down for too long.    How long can you stand comfortably? can stand longer than sitting    How long can you walk comfortably? 20 minutes    Patient Stated Goals to decrease pain and return to soccer.    Currently in Pain? No/denies                  Manual Talocrural  AP and PA mobilizations grade II-III x4 minutes STM to L and R ATFL insertion x9 minutes with implementation of effleurage and ptrissage   Half kneeling: Mobilization with anterior to posterior glide 10x 10 seconds each LE ; patient reports relief of symptoms    Trigger Point Dry Needling (TDN), unbilled Education performed with patient regarding potential benefit of TDN. Reviewed precautions and risks with patient. Reviewed special precautions/risks over lung fields which include pneumothorax. Reviewed signs and symptoms of pneumothorax and advised pt to go to ER immediately if these symptoms develop advise them of dry needling treatment. Extensive time spent with pt to ensure full understanding of TDN risks. Pt provided verbal consent to treatment. TDN performed to  with 0.25 x 40 single needle placements with local twitch response (LTR). Pistoning technique utilized. Improved pain-free motion following intervention to bilateral ATFLs x 4 minutes       TherEx Superset:  3 sets Exercise 1: heel walk 10 ft/ toe walk 10 ft  Exercise 2: three way cone tap on airex pad 10x each LE        Pt educated throughout session about proper posture and technique with exercises. Improved exercise technique, movement at target joints, use of target muscles after min to mod  verbal, visual, tactile cues     Patient demonstrates excellent motivation throughout physical therapy session. Focalized manual and TDN for muscle tissue lengthening performed with patient reporting improved symptoms by end of session. Patient is showing significant progress with ability to run without pain for first time. Pt will benefit from PT services to address deficits in strength, mobility, and pain in order to return to full function with activities and return to sport                  PT Education - 11/22/20 0803     Education Details exercise technique, TDN, manual    Person(s) Educated Patient    Methods  Explanation;Demonstration;Tactile cues;Verbal cues    Comprehension Verbalized understanding;Returned demonstration;Verbal cues required;Tactile cues required              PT Short Term Goals - 10/04/20 0839       PT SHORT TERM GOAL #1   Title Patient will be independent in home exercise program to improve strength/mobility for better functional independence with ADLs.    Baseline 5/25: HEP given    Time 6    Period Weeks    Status New    Target Date 11/15/20               PT Long Term Goals - 10/04/20 0840       PT LONG TERM GOAL #1   Title Patient will increase FOTO score to equal to or greater than 82%    to demonstrate statistically significant improvement in mobility and quality of life.    Baseline 5/25: 62%    Time 12    Period Weeks    Status New    Target Date 12/27/20      PT LONG TERM GOAL #2   Title Patient will return to playing soccer with pain increase of less than 3/10 for return to sport.    Baseline 5/25: unable to play without 10/10 pain    Time 12    Period Weeks    Status New    Target Date 12/27/20      PT LONG TERM GOAL #3   Title Patient will increase BLE gross strength to 5/5 as to improve functional strength for independent gait, increased standing tolerance and increased ADL ability.    Baseline 5/25: see note    Time 12    Period Weeks    Status New    Target Date 12/27/20      PT LONG TERM GOAL #4   Title Patient will squat and lunge with nuetral body mechanics without excessive internal rotation indicating improved alignment of LE's as well as strength.    Baseline 5/25: internal hip and knee collapse    Time 12    Period Weeks    Status New    Target Date 12/27/20      PT LONG TERM GOAL #5   Title Patient will report a worst pain of 3/10 on VAS in bilateral calves  to improve tolerance with ADLs and reduced symptoms with activities.    Baseline 5/25: 10/10    Time 12    Period Weeks    Status New    Target Date  12/27/20                   Plan - 11/22/20 1251     Clinical Impression Statement Patient demonstrates excellent motivation throughout physical therapy session. Focalized manual and TDN for muscle tissue  lengthening performed with patient reporting improved symptoms by end of session. Patient is showing significant progress with ability to run without pain for first time. Pt will benefit from PT services to address deficits in strength, mobility, and pain in order to return to full function with activities and return to sport    Personal Factors and Comorbidities Comorbidity 1;Past/Current Experience;Time since onset of injury/illness/exacerbation    Comorbidities history of ankle instability    Examination-Activity Limitations Carry;Sleep;Sit;Locomotion Level;Lift;Squat;Stairs;Stand;Transfers    Examination-Participation Restrictions Cleaning;Community Activity;Driving;Occupation;Meal Prep;Laundry;School;Volunteer;Yard Work;Other    Stability/Clinical Decision Making Stable/Uncomplicated    Rehab Potential Good    PT Frequency 1x / week    PT Duration 12 weeks    PT Treatment/Interventions ADLs/Self Care Home Management;Aquatic Therapy;Biofeedback;Cryotherapy;Electrical Stimulation;Iontophoresis 4mg /ml Dexamethasone;Moist Heat;Ultrasound;Traction;Gait training;Stair training;Functional mobility training;Neuromuscular re-education;Balance training;Therapeutic exercise;Therapeutic activities;Patient/family education;Manual techniques;Dry needling;Passive range of motion;Scar mobilization;Compression bandaging;Energy conservation;Splinting;Taping;Visual/perceptual remediation/compensation;Spinal Manipulations;Joint Manipulations    PT Next Visit Plan single leg stability, calf restriction reduction, hip rotation    PT Home Exercise Plan see above    Consulted and Agree with Plan of Care Patient             Patient will benefit from skilled therapeutic intervention in order to improve  the following deficits and impairments:  Abnormal gait, Decreased balance, Decreased coordination, Decreased mobility, Decreased range of motion, Difficulty walking, Decreased strength, Hypomobility, Increased edema, Impaired flexibility, Increased muscle spasms, Impaired sensation, Postural dysfunction, Improper body mechanics, Pain  Visit Diagnosis: Pain in left lower leg  Pain in right lower leg  Muscle weakness (generalized)     Problem List There are no problems to display for this patient.   , PT, DPT  11/22/2020, 12:53 PM  [facility] MAIN [facility] SERVICES [address] [place], [place], [place] Phone: [phone]   Fax:  [phone]  Name: Jaclyn Reyes MRN: Ebbie Latus Date of Birth: Sep 20, 2000

## 2020-11-29 ENCOUNTER — Ambulatory Visit: Payer: 59

## 2020-11-29 ENCOUNTER — Other Ambulatory Visit: Payer: Self-pay

## 2020-11-29 DIAGNOSIS — M79662 Pain in left lower leg: Secondary | ICD-10-CM

## 2020-11-29 DIAGNOSIS — M6281 Muscle weakness (generalized): Secondary | ICD-10-CM

## 2020-11-29 DIAGNOSIS — M79661 Pain in right lower leg: Secondary | ICD-10-CM

## 2020-11-29 NOTE — Therapy (Signed)
[facility] MAIN [facility] SERVICES [address] [place], [place], [address] Phone: [phone]   Fax:  [phone]  Physical Therapy Treatment  Patient Details  Name: Jaclyn Reyes MRN: [id number] Date of Birth: 06-26-2000 Referring Provider (PT): Tinnie Gens   Encounter Date: 11/29/2020   PT End of Session - 11/29/20 1155     Visit Number 9    Number of Visits 12    Date for PT Re-Evaluation 12/27/20    Authorization Type 9/10 eval 5/25    PT Start Time 1101    PT Stop Time 1145    PT Time Calculation (min) 44 min    Activity Tolerance Patient tolerated treatment well    Behavior During Therapy [facility] for tasks assessed/performed             History reviewed. No pertinent past medical history.  Past Surgical History:  Procedure Laterality Date   TYMPANOSTOMY TUBE PLACEMENT      There were no vitals filed for this visit.   Subjective Assessment - 11/29/20 1154     Subjective Patient reports she hasn't had any increases of pain since last session. Has been training ~ 3 hours a day including yoga.    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 Her pain started in fall of 2020 for her freshman year of soccer but was minor, it than re-occurred in fall of 2021 and became problematic limiting her ability to play, walk, and perform physical activities. She has seen her athletic trainer for it as well as doctor but has not had relief. Is on summer break at this time but will start training in mid summer    How long can you sit comfortably? bothers her in weightbearing and nonweightbearing, can't cross legs more than 5 minutes; hurts to have feet down for too long.    How long can you stand comfortably? can stand longer than sitting    How long can you walk comfortably? 20 minutes    Patient Stated Goals to decrease pain and return to soccer.    Currently in Pain? No/denies                    Manual Talocrural AP and PA mobilizations grade II-III x4 minutes STM to L and R ATFL insertion x7 minutes with implementation of effleurage and ptrissage   Half kneeling: Mobilization with anterior to posterior glide 10x 10 seconds each LE ; patient reports relief of symptoms    Trigger Point Dry Needling (TDN), unbilled Education performed with patient regarding potential benefit of TDN. Reviewed precautions and risks with patient. Reviewed special precautions/risks over lung fields which include pneumothorax. Reviewed signs and symptoms of pneumothorax and advised pt to go to ER immediately if these symptoms develop advise them of dry needling treatment. Extensive time spent with pt to ensure full understanding of TDN risks. Pt provided verbal consent to treatment. TDN performed to  with 0.25 x 40 single needle placements with local twitch response (LTR). Pistoning technique utilized. Improved pain-free motion following intervention to bilateral ATFLs x 4 minutes       TherEx Superset:  3 sets Exercise 1: forward backwards hop 10x  Exercise 2: static stand bosu ball with flat side up 30 seconds    Green dynadisc: opp LE on soccerball clockwise circles 10, counterclockwise circles 10x; finger tip support 2 sets each LE placement  Squat with hands on bar: raise onto toes and  return to standing (plie squat) 10x ; 2 sets  Ankle circles: cue for inversion stretch and reduce excessive eversion 10x each side, tactile cueing for arc of motion from PT      Pt educated throughout session about proper posture and technique with exercises. Improved exercise technique, movement at target joints, use of target muscles after min to mod verbal, visual, tactile cues    Patient is highly motivated throughout physical therapy session. She is able to perform dynamic pertubation intervention without pain increase. Her ankle strength will continue to benefit from focus at this time for  reduction of risk of re-injury. Education on cross training (especially swimming for low impact) importance performed with patient verbalizing understanding. Pt will benefit from PT services to address deficits in strength, mobility, and pain in order to return to full function with activities and return to sport                PT Education - 11/29/20 1155     Education Details exercise technique, TDN manual    Person(s) Educated Patient    Methods Explanation;Demonstration;Tactile cues;Verbal cues    Comprehension Verbalized understanding;Returned demonstration;Verbal cues required;Tactile cues required              PT Short Term Goals - 10/04/20 0839       PT SHORT TERM GOAL #1   Title Patient will be independent in home exercise program to improve strength/mobility for better functional independence with ADLs.    Baseline 5/25: HEP given    Time 6    Period Weeks    Status New    Target Date 11/15/20               PT Long Term Goals - 10/04/20 0840       PT LONG TERM GOAL #1   Title Patient will increase FOTO score to equal to or greater than 82%    to demonstrate statistically significant improvement in mobility and quality of life.    Baseline 5/25: 62%    Time 12    Period Weeks    Status New    Target Date 12/27/20      PT LONG TERM GOAL #2   Title Patient will return to playing soccer with pain increase of less than 3/10 for return to sport.    Baseline 5/25: unable to play without 10/10 pain    Time 12    Period Weeks    Status New    Target Date 12/27/20      PT LONG TERM GOAL #3   Title Patient will increase BLE gross strength to 5/5 as to improve functional strength for independent gait, increased standing tolerance and increased ADL ability.    Baseline 5/25: see note    Time 12    Period Weeks    Status New    Target Date 12/27/20      PT LONG TERM GOAL #4   Title Patient will squat and lunge with nuetral body mechanics without  excessive internal rotation indicating improved alignment of LE's as well as strength.    Baseline 5/25: internal hip and knee collapse    Time 12    Period Weeks    Status New    Target Date 12/27/20      PT LONG TERM GOAL #5   Title Patient will report a worst pain of 3/10 on VAS in bilateral calves  to improve tolerance with ADLs and reduced symptoms with activities.  Baseline 5/25: 10/10    Time 12    Period Weeks    Status New    Target Date 12/27/20                   Plan - 11/29/20 1202     Clinical Impression Statement Patient is highly motivated throughout physical therapy session. She is able to perform dynamic pertubation intervention without pain increase. Her ankle strength will continue to benefit from focus at this time for reduction of risk of re-injury. Education on cross training (especially swimming for low impact) importance performed with patient verbalizing understanding. Pt will benefit from PT services to address deficits in strength, mobility, and pain in order to return to full function with activities and return to sport    Personal Factors and Comorbidities Comorbidity 1;Past/Current Experience;Time since onset of injury/illness/exacerbation    Comorbidities history of ankle instability    Examination-Activity Limitations Carry;Sleep;Sit;Locomotion Level;Lift;Squat;Stairs;Stand;Transfers    Examination-Participation Restrictions Cleaning;Community Activity;Driving;Occupation;Meal Prep;Laundry;School;Volunteer;Yard Work;Other    Stability/Clinical Decision Making Stable/Uncomplicated    Rehab Potential Good    PT Frequency 1x / week    PT Duration 12 weeks    PT Treatment/Interventions ADLs/Self Care Home Management;Aquatic Therapy;Biofeedback;Cryotherapy;Electrical Stimulation;Iontophoresis 4mg /ml Dexamethasone;Moist Heat;Ultrasound;Traction;Gait training;Stair training;Functional mobility training;Neuromuscular re-education;Balance  training;Therapeutic exercise;Therapeutic activities;Patient/family education;Manual techniques;Dry needling;Passive range of motion;Scar mobilization;Compression bandaging;Energy conservation;Splinting;Taping;Visual/perceptual remediation/compensation;Spinal Manipulations;Joint Manipulations    PT Next Visit Plan single leg stability, calf restriction reduction, hip rotation    PT Home Exercise Plan see above    Consulted and Agree with Plan of Care Patient             Patient will benefit from skilled therapeutic intervention in order to improve the following deficits and impairments:  Abnormal gait, Decreased balance, Decreased coordination, Decreased mobility, Decreased range of motion, Difficulty walking, Decreased strength, Hypomobility, Increased edema, Impaired flexibility, Increased muscle spasms, Impaired sensation, Postural dysfunction, Improper body mechanics, Pain  Visit Diagnosis: Pain in left lower leg  Pain in right lower leg  Muscle weakness (generalized)     Problem List There are no problems to display for this patient.   , PT, DPT  11/29/2020, 12:03 PM  [facility] MAIN [facility] SERVICES [address] [place], [place], [place] Phone: [phone]   Fax:  [phone]  Name: Jaclyn Reyes MRN: Ebbie Latus Date of Birth: 07/04/2000

## 2020-12-06 ENCOUNTER — Ambulatory Visit: Payer: 59

## 2020-12-07 ENCOUNTER — Ambulatory Visit: Payer: 59

## 2020-12-13 ENCOUNTER — Ambulatory Visit: Payer: 59 | Attending: Family Medicine

## 2020-12-13 DIAGNOSIS — M79661 Pain in right lower leg: Secondary | ICD-10-CM | POA: Insufficient documentation

## 2020-12-13 DIAGNOSIS — M79662 Pain in left lower leg: Secondary | ICD-10-CM | POA: Insufficient documentation

## 2020-12-13 DIAGNOSIS — M6281 Muscle weakness (generalized): Secondary | ICD-10-CM | POA: Insufficient documentation

## 2020-12-18 ENCOUNTER — Other Ambulatory Visit: Payer: Self-pay | Admitting: Family Medicine

## 2020-12-18 DIAGNOSIS — Z3041 Encounter for surveillance of contraceptive pills: Secondary | ICD-10-CM

## 2020-12-20 ENCOUNTER — Other Ambulatory Visit: Payer: Self-pay

## 2020-12-20 ENCOUNTER — Ambulatory Visit: Payer: 59

## 2020-12-20 DIAGNOSIS — M79661 Pain in right lower leg: Secondary | ICD-10-CM

## 2020-12-20 DIAGNOSIS — M6281 Muscle weakness (generalized): Secondary | ICD-10-CM

## 2020-12-20 DIAGNOSIS — M79662 Pain in left lower leg: Secondary | ICD-10-CM

## 2020-12-20 NOTE — Therapy (Signed)
[place] MAIN [facility] SERVICES [address] [place], [place], [address] Phone: [phone]   Fax:  [phone]  Physical Therapy Treatment Physical Therapy Progress Note/DISCHARGE   Dates of reporting period  10/04/20   to   12/20/20   Patient Details  Name: Jaclyn Reyes MRN: [id number] Date of Birth: [birth year] Referring Provider (PT): Darron Doom   Encounter Date: 12/20/2020   PT End of Session - 12/20/20 0839     Visit Number 10    Number of Visits 12    Date for PT Re-Evaluation 12/27/20    Authorization Type 10/10 eval 5/25    PT Start Time 0715    PT Stop Time 0755    PT Time Calculation (min) 40 min    Activity Tolerance Patient tolerated treatment well    Behavior During Therapy [facility] for tasks assessed/performed             History reviewed. No pertinent past medical history.  Past Surgical History:  Procedure Laterality Date   TYMPANOSTOMY TUBE PLACEMENT      There were no vitals filed for this visit.   Subjective Assessment - 12/20/20 0726     Subjective Patient reports no pain increase with flying or soccer. Returning to school saturday. Today will be her last session.    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 Her pain started in fall of 2020 for her freshman year of soccer but was minor, it than re-occurred in fall of 2021 and became problematic limiting her ability to play, walk, and perform physical activities. She has seen her athletic trainer for it as well as doctor but has not had relief. Is on summer break at this time but will start training in mid summer    How long can you sit comfortably? bothers her in weightbearing and nonweightbearing, can't cross legs more than 5 minutes; hurts to have feet down for too long.    How long can you stand comfortably? can stand longer than sitting    How long can you walk comfortably? 20 minutes    Patient Stated Goals to decrease pain and  return to soccer.    Currently in Pain? No/denies                    Goals:  FOTO 91% Soccer pain VAS: no pain. (MET)  BLE strength: 5/5 (MET) Squat and lunge without internal rotation: slight IR with lunge but abel to self correct without external cue; no IR with squat (MET) Worst VAS: 1-2 with flying (MET)  Trigger Point Dry Needling (TDN), unbilled Education performed with patient regarding potential benefit of TDN. Reviewed precautions and risks with patient. Reviewed special precautions/risks over lung fields which include pneumothorax. Reviewed signs and symptoms of pneumothorax and advised pt to go to ER immediately if these symptoms develop advise them of dry needling treatment. Extensive time spent with pt to ensure full understanding of TDN risks. Pt provided verbal consent to treatment. TDN performed to  with 0.25 x 40 single needle placements with local twitch response (LTR). Pistoning technique utilized. Improved pain-free motion following intervention to bilateral ATFLs x 7 minutes   Manual Talocrural AP and PA mobilizations grade II-III x5 minutes STM to L and R ATFL insertion x8 minutes with implementation of effleurage and ptrissage    Education on cross training, massage, stretching and compliance with program for prevention of re-injury with return to full season.  Patient has met all goals at this time and is returning to college this weekend so will be discharged. She demonstrates understanding of need for compliance with cross training and muscle lengthening. I will be happy to see this patient again in the future again as needed.               PT Education - 12/20/20 0828     Education Details discharge, manual, TDN discharge instructions    Person(s) Educated Patient    Methods Explanation;Demonstration;Tactile cues;Verbal cues    Comprehension Verbalized understanding;Returned demonstration;Verbal cues required;Tactile cues required               PT Short Term Goals - 12/20/20 1113       PT SHORT TERM GOAL #1   Title Patient will be independent in home exercise program to improve strength/mobility for better functional independence with ADLs.    Baseline 5/25: HEP given 8/10: HEP Compliant    Time 6    Period Weeks    Status Achieved    Target Date 11/15/20               PT Long Term Goals - 12/20/20 1114       PT LONG TERM GOAL #1   Title Patient will increase FOTO score to equal to or greater than 82%    to demonstrate statistically significant improvement in mobility and quality of life.    Baseline 5/25: 62% 8/10: 91%    Time 12    Period Weeks    Status Achieved      PT LONG TERM GOAL #2   Title Patient will return to playing soccer with pain increase of less than 3/10 for return to sport.    Baseline 5/25: unable to play without 10/10 pain 8/10: no pain with soccer    Time 12    Period Weeks    Status Achieved      PT LONG TERM GOAL #3   Title Patient will increase BLE gross strength to 5/5 as to improve functional strength for independent gait, increased standing tolerance and increased ADL ability.    Baseline 5/25: see note 8/10: 5/5    Time 12    Period Weeks    Status Achieved      PT LONG TERM GOAL #4   Title Patient will squat and lunge with nuetral body mechanics without excessive internal rotation indicating improved alignment of LE's as well as strength.    Baseline 5/25: internal hip and knee collapse 8/10: slight IR with lunge but abel to self correct without external cue; no IR with squat    Time 12    Period Weeks    Status Achieved      PT LONG TERM GOAL #5   Title Patient will report a worst pain of 3/10 on VAS in bilateral calves  to improve tolerance with ADLs and reduced symptoms with activities.    Baseline 5/25: 10/10 8/10: 1-2/10    Time 12    Period Weeks    Status Achieved                   Plan - 12/20/20 1113     Clinical Impression Statement  Patient has met all goals at this time and is returning to college this weekend so will be discharged. She demonstrates understanding of need for compliance with cross training and muscle lengthening. I will be happy to see this patient again in the future  again as needed.    Personal Factors and Comorbidities Comorbidity 1;Past/Current Experience;Time since onset of injury/illness/exacerbation    Comorbidities history of ankle instability    Examination-Activity Limitations Carry;Sleep;Sit;Locomotion Level;Lift;Squat;Stairs;Stand;Transfers    Examination-Participation Restrictions Cleaning;Community Activity;Driving;Occupation;Meal Prep;Laundry;School;Volunteer;Yard Work;Other    Stability/Clinical Decision Making Stable/Uncomplicated    Rehab Potential Good    PT Frequency 1x / week    PT Duration 12 weeks    PT Treatment/Interventions ADLs/Self Care Home Management;Aquatic Therapy;Biofeedback;Cryotherapy;Electrical Stimulation;Iontophoresis 63m/ml Dexamethasone;Moist Heat;Ultrasound;Traction;Gait training;Stair training;Functional mobility training;Neuromuscular re-education;Balance training;Therapeutic exercise;Therapeutic activities;Patient/family education;Manual techniques;Dry needling;Passive range of motion;Scar mobilization;Compression bandaging;Energy conservation;Splinting;Taping;Visual/perceptual remediation/compensation;Spinal Manipulations;Joint Manipulations    PT Next Visit Plan single leg stability, calf restriction reduction, hip rotation    PT Home Exercise Plan see above    Consulted and Agree with Plan of Care Patient             Patient will benefit from skilled therapeutic intervention in order to improve the following deficits and impairments:  Abnormal gait, Decreased balance, Decreased coordination, Decreased mobility, Decreased range of motion, Difficulty walking, Decreased strength, Hypomobility, Increased edema, Impaired flexibility, Increased muscle spasms, Impaired  sensation, Postural dysfunction, Improper body mechanics, Pain  Visit Diagnosis: Pain in left lower leg  Pain in right lower leg  Muscle weakness (generalized)     Problem List There are no problems to display for this patient.   MJanna Arch PT, DPT  12/20/2020, 11:16 AM  [place] [facility] [address] [address]Phone: [phone]  Fax:  [phone] Name: Jaclyn GEBHARTMRN: [id number] of Birth: 117-Apr-2002

## 2020-12-27 ENCOUNTER — Ambulatory Visit: Payer: 59

## 2021-01-03 ENCOUNTER — Ambulatory Visit: Payer: 59

## 2021-01-10 ENCOUNTER — Ambulatory Visit: Payer: 59

## 2021-05-01 ENCOUNTER — Encounter: Payer: Self-pay | Admitting: Internal Medicine

## 2021-05-01 ENCOUNTER — Other Ambulatory Visit: Payer: Self-pay

## 2021-05-01 ENCOUNTER — Ambulatory Visit: Payer: 59 | Admitting: Internal Medicine

## 2021-05-01 VITALS — BP 108/68 | HR 83 | Temp 97.4°F | Ht 66.02 in | Wt 173.4 lb

## 2021-05-01 DIAGNOSIS — Z0000 Encounter for general adult medical examination without abnormal findings: Secondary | ICD-10-CM

## 2021-05-01 DIAGNOSIS — Z3041 Encounter for surveillance of contraceptive pills: Secondary | ICD-10-CM | POA: Diagnosis not present

## 2021-05-01 DIAGNOSIS — Z1329 Encounter for screening for other suspected endocrine disorder: Secondary | ICD-10-CM

## 2021-05-01 DIAGNOSIS — E538 Deficiency of other specified B group vitamins: Secondary | ICD-10-CM | POA: Diagnosis not present

## 2021-05-01 DIAGNOSIS — E611 Iron deficiency: Secondary | ICD-10-CM | POA: Diagnosis not present

## 2021-05-01 LAB — CBC WITH DIFFERENTIAL/PLATELET
Basophils Absolute: 0 10*3/uL (ref 0.0–0.1)
Basophils Relative: 0.9 % (ref 0.0–3.0)
Eosinophils Absolute: 0.1 10*3/uL (ref 0.0–0.7)
Eosinophils Relative: 2.4 % (ref 0.0–5.0)
HCT: 40.3 % (ref 36.0–46.0)
Hemoglobin: 13.5 g/dL (ref 12.0–15.0)
Lymphocytes Relative: 24.3 % (ref 12.0–46.0)
Lymphs Abs: 1.3 10*3/uL (ref 0.7–4.0)
MCHC: 33.4 g/dL (ref 30.0–36.0)
MCV: 83.8 fl (ref 78.0–100.0)
Monocytes Absolute: 0.5 10*3/uL (ref 0.1–1.0)
Monocytes Relative: 9.3 % (ref 3.0–12.0)
Neutro Abs: 3.5 10*3/uL (ref 1.4–7.7)
Neutrophils Relative %: 63.1 % (ref 43.0–77.0)
Platelets: 328 10*3/uL (ref 150.0–400.0)
RBC: 4.81 Mil/uL (ref 3.87–5.11)
RDW: 12.3 % (ref 11.5–14.6)
WBC: 5.5 10*3/uL (ref 4.5–10.5)

## 2021-05-01 LAB — IBC + FERRITIN
Ferritin: 34 ng/mL (ref 10.0–291.0)
Iron: 115 ug/dL (ref 42–145)
Saturation Ratios: 19.9 % — ABNORMAL LOW (ref 20.0–50.0)
TIBC: 576.8 ug/dL — ABNORMAL HIGH (ref 250.0–450.0)
Transferrin: 412 mg/dL — ABNORMAL HIGH (ref 212.0–360.0)

## 2021-05-01 LAB — TSH: TSH: 1.74 u[IU]/mL (ref 0.35–5.50)

## 2021-05-01 LAB — VITAMIN B12: Vitamin B-12: 362 pg/mL (ref 211–911)

## 2021-05-01 NOTE — Progress Notes (Addendum)
Chief Complaint  Patient presents with   Establish Care   Annual 1. C/o chronic shin splits  2. 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 Wants new OCP which does not cause wt gain refer Dr. Linda Hedges    Review of Systems  Constitutional:  Negative for weight loss.  HENT:  Negative for hearing loss.   Eyes:  Negative for blurred vision.  Respiratory:  Negative for shortness of breath.   Cardiovascular:  Negative for chest pain.  Gastrointestinal:  Negative for abdominal pain and blood in stool.  Genitourinary:  Negative for dysuria.  Musculoskeletal:  Negative for falls and joint pain.  Skin:  Negative for rash.  Neurological:  Negative for headaches.  Psychiatric/Behavioral:  Negative for depression.   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   Shin splints    Past Surgical History:  Procedure Laterality Date   gum grafting     04/25/21   TYMPANOSTOMY TUBE PLACEMENT     Family History  Problem Relation Age of Onset   Healthy Mother    Healthy Father    Social History   Socioeconomic History   Marital status: Single    Spouse name: Not on file   Number of children: Not on file   Years of education: Not on file   Highest education level: Not on file  Occupational History   Not on file  Tobacco Use   Smoking status: Never   Smokeless tobacco: Never  Vaping Use   Vaping Use: Never used  Substance and Sexual Activity   Alcohol use: Not Currently   Drug use: Not Currently   Sexual activity: Yes    Birth control/protection: Condom, Pill  Other Topics Concern   Not on file  Social History Narrative   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 Objective  Body mass index is 27.97 kg/m. Wt Readings from Last 3 Encounters:  05/01/21 173 lb 6.4 oz (78.7 kg)  05/16/20 167 lb (75.8 kg) (91 %, Z= 1.35)*  04/24/20 150 lb (68 kg) (82 %, Z= 0.90)*   * Growth percentiles are based on CDC (Girls, 2-20 Years) data.   Temp Readings from Last 3 Encounters:  05/01/21 (!) 97.4 F (36.3 C) (Temporal)  04/24/20 99.2 F (37.3 C) (Oral)   BP Readings from Last 3 Encounters:  05/01/21 108/68  05/16/20 115/74  04/24/20 105/69   Pulse Readings from Last 3 Encounters:  05/01/21 83  05/16/20 84  04/24/20 80    Physical Exam Vitals and nursing note reviewed.  Constitutional:  Appearance: Normal appearance. She is well-developed and well-groomed.  HENT:     Head: Normocephalic and atraumatic.  Eyes:     Conjunctiva/sclera: Conjunctivae normal.     Pupils: Pupils are equal, round, and reactive to light.  Cardiovascular:     Rate and Rhythm: Normal rate and regular rhythm.     Heart sounds: Normal heart sounds. No murmur heard. Pulmonary:     Effort: Pulmonary effort is normal.     Breath sounds: Normal breath sounds.  Abdominal:     General: Abdomen is flat. Bowel sounds are normal.     Tenderness: There is no abdominal tenderness.  Musculoskeletal:        General: No tenderness.  Skin:    General: Skin is warm and dry.  Neurological:     General: No focal deficit present.     Mental Status: She is alert and  oriented to person, place, and time. Mental status is at baseline.     Cranial Nerves: Cranial nerves 2-12 are intact.     Gait: Gait is intact.  Psychiatric:        Attention and Perception: Attention and perception normal.        Mood and Affect: Mood and affect normal.        Speech: Speech normal.        Behavior: Behavior normal. Behavior is cooperative.        Thought Content: Thought content normal.        Cognition and Memory: Cognition and memory normal.        Judgment: Judgment normal.    Assessment  Plan  Annual physical exam -  See below  Rec healthy diet and exercise   Encounter for surveillance of contraceptive pills - Plan: Ambulatory referral to Obstetrics / Gynecology Dr. Lynnette Caffey  Flu shot will get today with brother Pfizer x 2 total 3 shots  Tdap had 02/14/12  3/3 hep B Pcv7 x 4  Hep A x 2  Hpv9 x 2  Mcv40 x 2  Hiv x 4  Menb-4c x 2 Mmr x 2  Ipv x 4  Var x 2 Covid x 3 11/12/19, 12/03/19, and 05/19/20  HPV utd  Pap age 32 LMP12/2/22   Eye doctor Dr. Terrilee Files  Dr. Chrisandra Carota  Ob/gyn Dr. Kennon Rounds   Provider: Dr. Olivia Mackie McLean-Scocuzza-Internal Medicine

## 2021-05-01 NOTE — Patient Instructions (Addendum)
Aspercream with lidocaine  Lidocaine pain patches  Volataren gel   Consider 4th dose    Fiber gummies, benefiber, metamucil  Oatmeal  Chia seeds   Keratosis pilaris read about on [facility] or web MD    DO No Physician   Primary Contact Information Dr. Mitchel Honour  Phone Fax E-mail Address  [phone] Not available [address]   [address], [address]   [place] [address]   Evette Cristal Splints Rehab Ask your health care provider which exercises are safe for you. Do exercises exactly as told by your health care provider and adjust them as directed. It is normal to feel mild stretching, pulling, tightness, or discomfort as you do these exercises. Stop right away if you feel sudden pain or your pain gets worse. Do not begin these exercises until told by your health care provider. Stretching and range-of-motion exercise This exercise warms up your muscles and joints and improves the movement and flexibility of your lower leg. This exercise also helps to relieve pain. Calf stretch, standing Stand with the ball of your left / right foot on a step. The ball of your foot is on the walking surface, right under your toes. Keep your other foot firmly on the same step. Hold on to the wall, a railing, or a chair for balance. Slowly lift your other foot, allowing your body weight to press your left / right heel down over the edge of the step. You should feel a stretch in your left / right calf. Hold this position for __________ seconds. Repeat this exercise with a slight bend in your left / right knee. Repeat __________ times with your left / right knee straight and __________ times with your left / right knee bent. Complete this exercise __________ times a day. Strengthening exercises These exercises build strength and endurance in your lower leg. Endurance is the ability to use your muscles for a long time, even after they get tired. Dorsiflexion with  band  Secure a rubber exercise band or tubing to a fixed object, such as a table leg or a pole. Secure the other end of the band around your left / right foot. Sit on the floor, facing the fixed object with your left / right leg extended. The band should be slightly tense when your foot is relaxed. Slowly use your ankle muscles to pull your foot toward you (dorsiflexion). Hold this position for __________ seconds. Slowly release the tension in the band and return your foot to the starting position. Repeat __________ times. Complete this exercise __________ times a day. Ankle eversion with band  Secure one end of a rubber exercise band or tubing to a fixed object, such as a table leg or a pole, that will stay in place when the band is pulled. Loop the other end of the band around the middle of your left / right foot. Sit on the floor, facing the fixed object. The band should be slightly tense when your foot is relaxed. Make fists with your hands and put them between your knees. This will focus your strengthening at your ankle. Leading with your little toe, slowly push your banded foot outward, away from your other leg (eversion). Make sure the band is positioned to resist the entire motion. Hold this position for __________ seconds. Control the tension in the band as you slowly return your foot to the starting position. Repeat __________ times. Complete this exercise __________ times a day. Ankle  inversion with band  Secure one end of a rubber exercise band or tubing to a fixed object, such as a table leg or a pole, that will stay in place when the band is pulled. Loop the other end of the band around your left / right foot, just below your toes. Sit on the floor, facing the fixed object. The band should be slightly tense when your foot is relaxed. Make fists with your hands and put them between your knees. This will focus your strengthening at your ankle. Leading with your big toe, slowly  pull your banded foot inward, toward your other leg (inversion). Make sure the band is positioned to resist the entire motion. Hold this position for __________ seconds. Control the tension in the band as you slowly return your foot to the starting position. Repeat __________ times. Complete this exercise __________ times a day. Lateral walking with band This is an exercise in which you walk sideways (lateral), with tension provided by an exercise band. Stand in a long hallway. Wrap a loop of exercise band around your legs, just above your knees. Bend your knees gently and drop your hips down and back so your weight is over your heels. Step to the side to move down the length of the hallway, keeping your toes pointed forward and keeping tension in the band. Repeat, leading with your other leg. Repeat __________ times. Complete this exercise __________ times a day. Balance exercise This exercise will help improve your control of your foot and ankle when you are standing or walking. Single leg stance  Without wearing shoes, stand near a railing or in a doorway. You may hold on to the railing or door frame as needed. Stand on your left / right foot. Keep your big toe down on the floor and try to keep your arch lifted. If this exercise is too easy, you can try doing it with your eyes closed or while standing on a pillow. Hold this position for __________ seconds. Repeat __________ times. Complete this exercise __________ times a day. This information is not intended to replace advice given to you by your health care provider. Make sure you discuss any questions you have with your health care provider. Document Revised: 11/20/2020 Document Reviewed: 11/20/2020 Elsevier Patient Education  2022 [employer] Inc.  Evette Cristal Splints Shin splints is a painful condition that is felt either on the bone that is located in the front of the lower leg (tibia or shin bone) or in the muscles on either side of the  bone. This condition happens when physical activities lead to inflammation of the muscles, tendons, and the thin layer of tissue that covers the shin bone. It may result from participating in sports or other intense exercise. What are the causes? This condition may be caused by: Overuse of muscles. Repetitive activities. Flat feet or rigid arches. Activities that could contribute to shin splints include: Having a sudden increase in exercise time. Starting a new, intense activity. Running up hills or long distances. Playing sports that involve sudden starts and stops. Not warming up before activity. Wearing old or worn-out shoes. What are the signs or symptoms? The main symptom of this condition is pain that occurs: On the front of the lower leg. In the muscles on either side of the shin bone. While exercising or at rest. How is this diagnosed? This condition may be diagnosed based on: A physical exam. Your symptoms. An observation of you while you are walking or running. X-rays  or other imaging tests. These may be done to rule out other problems. How is this treated? Treatment for this condition depends on your age, history, overall health, and how bad the pain is. Most cases of shin splints can be managed by doing one or more of the following: Resting. Reducing the length and intensity of your exercise. Stopping the activity that causes shin pain. Taking medicines to control the inflammation. Icing, massaging, stretching, and strengthening the affected area. Wearing shoes that have rigid heels, shock absorption, and a good arch support. For severe shin pain, your health care provider may recommend that you use crutches to avoid putting weight on your legs. Follow these instructions at home: Medicines Take over-the-counter and prescription medicines only as told by your health care provider. Do not drive or use heavy machinery while taking prescription pain medicine. Managing  pain, stiffness, and swelling   If directed, apply ice to the painful area. Icing can help to relieve pain and swelling. Put ice in a plastic bag. Place a towel between your skin and the bag. Leave the ice on for 20 minutes, 2-3 times a day. If directed, apply heat to the painful area before stretching exercises, or as told by your health care provider. Heat can help to relax your muscles. Use the heat source that your health care provider recommends, such as a moist heat pack or a heating pad. Place a towel between your skin and the heat source. Leave the heat on for 20-30 minutes. Remove the heat if your skin turns bright red. This is especially important if you are unable to feel pain, heat, or cold. You may have a greater risk of getting burned. Massage, stretch, and strengthen the affected area as directed by your health care provider. Wear compression sleeves or socks as told by your health care provider. Raise (elevate) your legs above the level of your heart while you are sitting or lying down. Activity Rest as needed. Return to activity gradually as told by your health care provider. When you start exercising again, begin with non-weight-bearing exercises, such as cycling or swimming. Stop running if the pain returns. Warm up properly before exercising. Run on a surface that is level and fairly firm. Gradually change the intensity of an exercise. If you increase your running distance, add only 5-10% to your distance each week. This means that if you are running 5 miles this week, you should only increase your run by - mile for next week. General instructions Wear shoes that have rigid heels, shock absorption, and a good arch support. Change your athletic shoes every 6 months, or every 350-450 miles. Keep all follow-up visits as told by your health care provider. This is important. Contact a health care provider if: Your symptoms continue, or they worsen even after treatment. The  location, intensity, or type of pain changes over time. You have swelling in your lower leg that gets worse. Your shin becomes red and feels warm. Get help right away if: You have severe pain. You have trouble walking. Summary Shin splints happens when physical activities lead to inflammation of the muscles, tendons, and the thin layer of tissue that covers the shin bone. Treatments may include medicines, resting, and icing. Return to activity gradually as directed by your health care provider. Make sure you know what symptoms should cause you to contact your health care provider. This information is not intended to replace advice given to you by your health care provider. Make sure you  discuss any questions you have with your health care provider. Document Revised: 11/17/2020 Document Reviewed: 11/17/2020 Elsevier Patient Education  2022 [employer].

## 2021-05-02 LAB — URINALYSIS, ROUTINE W REFLEX MICROSCOPIC
Bacteria, UA: NONE SEEN /HPF
Bilirubin Urine: NEGATIVE
Glucose, UA: NEGATIVE
Hgb urine dipstick: NEGATIVE
Hyaline Cast: NONE SEEN /LPF
Ketones, ur: NEGATIVE
Leukocytes,Ua: NEGATIVE
Nitrite: NEGATIVE
RBC / HPF: NONE SEEN /HPF (ref 0–2)
Specific Gravity, Urine: 1.02 (ref 1.001–1.035)
pH: 5.5 (ref 5.0–8.0)

## 2021-05-02 LAB — MICROSCOPIC MESSAGE

## 2021-05-22 ENCOUNTER — Encounter: Payer: Self-pay | Admitting: Internal Medicine

## 2021-06-11 ENCOUNTER — Telehealth: Payer: Self-pay | Admitting: Internal Medicine

## 2021-06-11 NOTE — Telephone Encounter (Signed)
ejection Reason - Patient did not respond - MB IS FULL CANNOT CONTACT PT" Elgie Collard said on Jun 07, 2021 8:41 AM  "MB IS FULL" Elgie Collard said on Jun 07, 2021 8:41 AM  "MB IS FULL" Elgie Collard said on May 24, 2021 10:53 AM  "MB IS FULL" Elgie Collard said on May 03, 2021 2:14 PM  I spoke with pt she stated she spoke with the ofc and no appt was avail until February pt is back at school she stated she will sch when she comes back home.  Msg from [facility]

## 2022-02-15 ENCOUNTER — Other Ambulatory Visit: Payer: Self-pay | Admitting: Family Medicine

## 2022-02-15 DIAGNOSIS — Z3041 Encounter for surveillance of contraceptive pills: Secondary | ICD-10-CM

## 2022-02-18 ENCOUNTER — Other Ambulatory Visit: Payer: Self-pay | Admitting: *Deleted

## 2022-02-18 DIAGNOSIS — Z3041 Encounter for surveillance of contraceptive pills: Secondary | ICD-10-CM

## 2022-02-18 MED ORDER — NORGESTIMATE-ETH ESTRADIOL 0.25-35 MG-MCG PO TABS: 1.0000 | ORAL_TABLET | Freq: Every day | ORAL | 1 refills | Status: DC

## 2022-04-11 ENCOUNTER — Other Ambulatory Visit: Payer: Self-pay | Admitting: Family Medicine

## 2022-04-11 DIAGNOSIS — Z3041 Encounter for surveillance of contraceptive pills: Secondary | ICD-10-CM

## 2022-04-15 ENCOUNTER — Other Ambulatory Visit: Payer: Self-pay | Admitting: *Deleted

## 2022-04-15 DIAGNOSIS — Z3041 Encounter for surveillance of contraceptive pills: Secondary | ICD-10-CM

## 2022-04-15 MED ORDER — NORGESTIMATE-ETH ESTRADIOL 0.25-35 MG-MCG PO TABS: 1.0000 | ORAL_TABLET | Freq: Every day | ORAL | 0 refills | Status: DC

## 2022-04-17 ENCOUNTER — Ambulatory Visit: Payer: 59 | Admitting: Obstetrics & Gynecology

## 2022-04-29 ENCOUNTER — Other Ambulatory Visit: Payer: Self-pay

## 2022-04-29 DIAGNOSIS — Z3041 Encounter for surveillance of contraceptive pills: Secondary | ICD-10-CM

## 2022-04-29 MED ORDER — NORGESTIMATE-ETH ESTRADIOL 0.25-35 MG-MCG PO TABS: 1.0000 | ORAL_TABLET | Freq: Every day | ORAL | 1 refills | Status: DC

## 2022-05-01 ENCOUNTER — Encounter: Payer: 59 | Admitting: Internal Medicine

## 2022-05-10 ENCOUNTER — Other Ambulatory Visit: Payer: Self-pay | Admitting: Obstetrics & Gynecology

## 2022-05-10 DIAGNOSIS — Z3041 Encounter for surveillance of contraceptive pills: Secondary | ICD-10-CM

## 2022-05-23 ENCOUNTER — Encounter: Payer: Self-pay | Admitting: Advanced Practice Midwife

## 2022-05-23 ENCOUNTER — Ambulatory Visit (INDEPENDENT_AMBULATORY_CARE_PROVIDER_SITE_OTHER): Payer: 59 | Admitting: Advanced Practice Midwife

## 2022-05-23 ENCOUNTER — Other Ambulatory Visit (HOSPITAL_COMMUNITY)
Admission: RE | Admit: 2022-05-23 | Discharge: 2022-05-23 | Disposition: A | Discharge status: Home / Self Care | Payer: 59 | Source: Ambulatory Visit | Attending: Family Medicine | Admitting: Family Medicine

## 2022-05-23 VITALS — BP 118/78 | HR 81 | Ht 65.0 in | Wt 173.0 lb

## 2022-05-23 DIAGNOSIS — Z3041 Encounter for surveillance of contraceptive pills: Secondary | ICD-10-CM

## 2022-05-23 DIAGNOSIS — Z3009 Encounter for other general counseling and advice on contraception: Secondary | ICD-10-CM

## 2022-05-23 DIAGNOSIS — N898 Other specified noninflammatory disorders of vagina: Secondary | ICD-10-CM

## 2022-05-23 DIAGNOSIS — Z01419 Encounter for gynecological examination (general) (routine) without abnormal findings: Secondary | ICD-10-CM | POA: Diagnosis not present

## 2022-05-23 MED ORDER — NORGESTIMATE-ETH ESTRADIOL 0.25-35 MG-MCG PO TABS: ORAL_TABLET | ORAL | 4 refills | Status: DC

## 2022-05-23 NOTE — Progress Notes (Signed)
GYNECOLOGY ANNUAL PREVENTATIVE CARE ENCOUNTER NOTE  History:     Jaclyn Reyes is a 22 y.o. G0P0 female here for a routine annual gynecologic exam including first pap smear.  Current complaints: none.   Denies abnormal vaginal bleeding, discharge, pelvic pain, problems with intercourse or other gynecologic concerns.   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 Sexually active, 1 partner, + penetrative sex. Non-smoker.  Has used OCPs since about 6th grade. Happy with them but open to discussing alternatives.   Gynecologic History No LMP recorded. (Menstrual status: Oral contraceptives). Contraception: OCP (estrogen/progesterone) Last Pap: N/A first pap today.   Obstetric History OB History  No obstetric history on file.    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    No Known Allergies  Social History:  reports that she has never smoked. She has never used smokeless tobacco. She reports that she does not currently use alcohol. She reports that she does not currently use drugs.  Family History  Problem Relation Age of Onset   Healthy Mother    Healthy Father     The following portions of the patient's history were reviewed and updated as appropriate: allergies, current medications, past family history, past medical history, past social history, past surgical history and problem list.  Review of  Systems Pertinent items noted in HPI and remainder of comprehensive ROS otherwise negative.  Physical Exam:  BP 118/78   Pulse 81   Ht 5\' 5"  (1.651 m)   Wt 173 lb (78.5 kg)   BMI 28.79 kg/m  CONSTITUTIONAL: Well-developed, well-nourished female in no acute distress.  HENT:  Normocephalic, atraumatic, External right and left ear normal.  EYES: Conjunctivae and EOM are normal. Pupils are equal, round, and reactive to light. No scleral icterus.  SKIN: Skin is warm and dry. No rash noted. Not diaphoretic. No erythema. No pallor. MUSCULOSKELETAL: Normal range of motion. No tenderness.  No cyanosis, clubbing, or edema. NEUROLOGIC: Alert and oriented to person, place, and time. Normal reflexes, muscle tone coordination.  PSYCHIATRIC: Normal mood and affect. Normal behavior. Normal judgment and thought content. CARDIOVASCULAR: Normal heart rate noted, regular rhythm RESPIRATORY: Clear to auscultation bilaterally. Effort and breath sounds normal, no problems with respiration noted. BREASTS: Symmetric in size. No masses, tenderness, skin changes, nipple drainage, or lymphadenopathy bilaterally. Performed in the presence of a chaperone. ABDOMEN: Soft, no distention noted.  No tenderness, rebound or guarding.  PELVIC: Normal appearing external genitalia and urethral meatus; normal appearing vaginal mucosa and cervix.  Small amount of thin whitish discharge near posterior vault noted.  Pap smear obtained.  Performed in the presence of a chaperone.   Assessment and Plan:    1. Encounter for surveillance of contraceptive pills  - norgestimate-ethinyl estradiol (ORTHO-CYCLEN) 0.25-35 MG-MCG tablet; TAKE 1 TABLET BY MOUTH EVERY DAY(INS REQUIRES 90  DAYS)  Dispense: 84 tablet; Refill: 4  2. Vaginal discharge  - Cervicovaginal ancillary only( [place])  3. Well woman exam with routine gynecological exam  - Cytology - PAP  4. Encounter for counseling regarding contraception - Does not desire  detailed discussion of options or initiation of other methods today - Discussed general movement towards LARCs with contraception counseling due to effectiveness and low side effect profile for most patients - Navigated to [email or web address].org and comparison tool for patient to use at home  Will follow up results of pap smear and manage accordingly. Please refer to After Visit Summary for other counseling recommendations.      Mallie Snooks, [place], MSN, CNM Certified Nurse Midwife, Product/process development scientist for [employer], [place]

## 2022-05-23 NOTE — Progress Notes (Signed)
Patient presents for Annual.  LMP:05/08/22 Monthly with moderate to heavy flow lasting 5 days.  Last pap:N/A Contraception:Pills wants to discuss management. Mammogram:N/A STD Screening: Just GC/CT on pap Flu Vaccine : Already received.   CC: Hard losing weight with BCP's.  Fun Fact: Architectural technologist.

## 2022-05-24 LAB — CYTOLOGY - PAP
Chlamydia: NEGATIVE
Comment: NEGATIVE
Comment: NORMAL
Diagnosis: NEGATIVE
Neisseria Gonorrhea: NEGATIVE

## 2022-05-24 LAB — CERVICOVAGINAL ANCILLARY ONLY
Bacterial Vaginitis (gardnerella): NEGATIVE
Candida Glabrata: NEGATIVE
Candida Vaginitis: NEGATIVE
Comment: NEGATIVE
Comment: NEGATIVE
Comment: NEGATIVE

## 2023-04-24 ENCOUNTER — Other Ambulatory Visit: Payer: Self-pay | Admitting: Otolaryngology

## 2023-05-31 ENCOUNTER — Other Ambulatory Visit: Payer: Self-pay | Admitting: Obstetrics & Gynecology

## 2023-05-31 DIAGNOSIS — Z3041 Encounter for surveillance of contraceptive pills: Secondary | ICD-10-CM

## 2023-06-17 DIAGNOSIS — J342 Deviated nasal septum: Secondary | ICD-10-CM | POA: Diagnosis not present

## 2023-06-17 DIAGNOSIS — J3489 Other specified disorders of nose and nasal sinuses: Secondary | ICD-10-CM | POA: Diagnosis not present

## 2023-06-25 ENCOUNTER — Other Ambulatory Visit: Payer: Self-pay

## 2023-06-25 NOTE — Anesthesia Preprocedure Evaluation (Addendum)
 Anesthesia Evaluation  Patient identified by MRN, date of birth, ID band Patient awake    Reviewed: Allergy & Precautions, H&P , NPO status , Patient's Chart, lab work & pertinent test results  Airway Mallampati: I  TM Distance: >3 FB Neck ROM: Full    Dental no notable dental hx.  Lovely, perfect teeth at this time:   Pulmonary neg pulmonary ROS   Pulmonary exam normal breath sounds clear to auscultation       Cardiovascular negative cardio ROS Normal cardiovascular exam Rhythm:Regular Rate:Normal     Neuro/Psych  PSYCHIATRIC DISORDERS  Depression    negative neurological ROS  negative psych ROS   GI/Hepatic negative GI ROS, Neg liver ROS,,,  Endo/Other  negative endocrine ROS    Renal/GU negative Renal ROS  negative genitourinary   Musculoskeletal negative musculoskeletal ROS (+)    Abdominal   Peds negative pediatric ROS (+)  Hematology negative hematology ROS (+)   Anesthesia Other Findings COVID-19  Shin splints Depression  Eating disorder Hearing loss  Very pleasant young lady, negative HCG   Reproductive/Obstetrics negative OB ROS                             Anesthesia Physical Anesthesia Plan  ASA: 2  Anesthesia Plan: General ETT   Post-op Pain Management:    Induction: Intravenous  PONV Risk Score and Plan:   Airway Management Planned: Oral ETT  Additional Equipment:   Intra-op Plan:   Post-operative Plan: Extubation in OR  Informed Consent: I have reviewed the patients History and Physical, chart, labs and discussed the procedure including the risks, benefits and alternatives for the proposed anesthesia with the patient or authorized representative who has indicated his/her understanding and acceptance.     Dental Advisory Given  Plan Discussed with: Anesthesiologist, CRNA and Surgeon  Anesthesia Plan Comments: (Patient consented for risks of anesthesia  including but not limited to:  - adverse reactions to medications - damage to eyes, teeth, lips or other oral mucosa - nerve damage due to positioning  - sore throat or hoarseness - Damage to heart, brain, nerves, lungs, other parts of body or loss of life  Patient voiced understanding and assent.)       Anesthesia Quick Evaluation

## 2023-07-07 NOTE — Discharge Instructions (Signed)

## 2023-07-09 ENCOUNTER — Ambulatory Visit
Admission: RE | Admit: 2023-07-09 | Discharge: 2023-07-09 | Disposition: A | Discharge status: Home / Self Care | Payer: 59 | Attending: Otolaryngology | Admitting: Otolaryngology

## 2023-07-09 ENCOUNTER — Ambulatory Visit: Payer: 59 | Admitting: Anesthesiology

## 2023-07-09 ENCOUNTER — Encounter: Payer: Self-pay | Admitting: Otolaryngology

## 2023-07-09 ENCOUNTER — Encounter
Admission: RE | Disposition: A | Discharge status: Home / Self Care | Payer: Self-pay | Source: Home / Self Care | Attending: Otolaryngology

## 2023-07-09 ENCOUNTER — Other Ambulatory Visit: Payer: Self-pay

## 2023-07-09 DIAGNOSIS — J3489 Other specified disorders of nose and nasal sinuses: Secondary | ICD-10-CM | POA: Insufficient documentation

## 2023-07-09 DIAGNOSIS — J342 Deviated nasal septum: Secondary | ICD-10-CM | POA: Insufficient documentation

## 2023-07-09 DIAGNOSIS — J343 Hypertrophy of nasal turbinates: Secondary | ICD-10-CM | POA: Insufficient documentation

## 2023-07-09 HISTORY — PX: TURBINATE REDUCTION: SHX6157

## 2023-07-09 HISTORY — PX: SEPTOPLASTY: SHX2393

## 2023-07-09 LAB — POCT PREGNANCY, URINE: Preg Test, Ur: NEGATIVE

## 2023-07-09 SURGERY — SEPTOPLASTY, NOSE
Anesthesia: General | Laterality: Bilateral

## 2023-07-09 MED ORDER — MIDAZOLAM HCL 2 MG/2ML IJ SOLN
INTRAMUSCULAR | Status: AC
  Filled 2023-07-09: qty 2

## 2023-07-09 MED ORDER — SODIUM CHLORIDE 0.9 % IV SOLN: INTRAVENOUS | Status: DC | PRN

## 2023-07-09 MED ORDER — ONDANSETRON HCL 4 MG/2ML IJ SOLN
INTRAMUSCULAR | Status: DC | PRN
  Administered 2023-07-09: 4 mg via INTRAVENOUS

## 2023-07-09 MED ORDER — MIDAZOLAM HCL 5 MG/5ML IJ SOLN
INTRAMUSCULAR | Status: DC | PRN
  Administered 2023-07-09: 2 mg via INTRAVENOUS

## 2023-07-09 MED ORDER — OXYCODONE-ACETAMINOPHEN 5-325 MG PO TABS: 1.0000 | ORAL_TABLET | ORAL | 0 refills | Status: DC | PRN

## 2023-07-09 MED ORDER — AMOXICILLIN-POT CLAVULANATE 875-125 MG PO TABS: 1.0000 | ORAL_TABLET | Freq: Two times a day (BID) | ORAL | 0 refills | Status: AC

## 2023-07-09 MED ORDER — ACETAMINOPHEN 10 MG/ML IV SOLN
INTRAVENOUS | Status: AC
  Filled 2023-07-09: qty 100

## 2023-07-09 MED ORDER — FENTANYL CITRATE (PF) 100 MCG/2ML IJ SOLN
INTRAMUSCULAR | Status: DC | PRN
  Administered 2023-07-09 (×2): 50 ug via INTRAVENOUS

## 2023-07-09 MED ORDER — SUCCINYLCHOLINE CHLORIDE 200 MG/10ML IV SOSY
PREFILLED_SYRINGE | INTRAVENOUS | Status: DC | PRN
  Administered 2023-07-09: 140 mg via INTRAVENOUS

## 2023-07-09 MED ORDER — PROPOFOL 10 MG/ML IV BOLUS
INTRAVENOUS | Status: AC
Start: 2023-07-09 — End: ?
  Filled 2023-07-09: qty 20

## 2023-07-09 MED ORDER — ACETAMINOPHEN 10 MG/ML IV SOLN
1000.0000 mg | Freq: Once | INTRAVENOUS | Status: AC
  Administered 2023-07-09: 1000 mg via INTRAVENOUS

## 2023-07-09 MED ORDER — ACETAMINOPHEN 10 MG/ML IV SOLN: 1000.0000 mg | Freq: Once | INTRAVENOUS | Status: DC

## 2023-07-09 MED ORDER — DEXMEDETOMIDINE HCL IN NACL 80 MCG/20ML IV SOLN
INTRAVENOUS | Status: DC | PRN
  Administered 2023-07-09: 12 ug via INTRAVENOUS

## 2023-07-09 MED ORDER — LIDOCAINE-EPINEPHRINE 1 %-1:100000 IJ SOLN
INTRAMUSCULAR | Status: DC | PRN
  Administered 2023-07-09: 8 mL

## 2023-07-09 MED ORDER — DEXAMETHASONE SODIUM PHOSPHATE 4 MG/ML IJ SOLN
INTRAMUSCULAR | Status: DC | PRN
  Administered 2023-07-09: 4 mg via INTRAVENOUS

## 2023-07-09 MED ORDER — OXYMETAZOLINE HCL 0.05 % NA SOLN
NASAL | Status: DC | PRN
  Administered 2023-07-09: 1 via TOPICAL

## 2023-07-09 MED ORDER — LIDOCAINE HCL (CARDIAC) PF 100 MG/5ML IV SOSY
PREFILLED_SYRINGE | INTRAVENOUS | Status: DC | PRN
  Administered 2023-07-09: 60 mg via INTRAVENOUS

## 2023-07-09 MED ORDER — ONDANSETRON HCL 4 MG PO TABS: 4.0000 mg | ORAL_TABLET | Freq: Three times a day (TID) | ORAL | 0 refills | Status: DC | PRN

## 2023-07-09 MED ORDER — PROPOFOL 10 MG/ML IV BOLUS
INTRAVENOUS | Status: DC | PRN
  Administered 2023-07-09: 200 mg via INTRAVENOUS

## 2023-07-09 MED ORDER — TRIPLE ANTIBIOTIC 3.5-400-5000 EX OINT
TOPICAL_OINTMENT | CUTANEOUS | Status: DC | PRN
  Administered 2023-07-09: 1 via TOPICAL

## 2023-07-09 MED ORDER — FENTANYL CITRATE (PF) 100 MCG/2ML IJ SOLN
INTRAMUSCULAR | Status: AC
  Filled 2023-07-09: qty 2

## 2023-07-09 MED ORDER — GLYCOPYRROLATE 0.2 MG/ML IJ SOLN
INTRAMUSCULAR | Status: DC | PRN
  Administered 2023-07-09: .2 mg via INTRAVENOUS

## 2023-07-09 SURGICAL SUPPLY — 22 items
ANTIFOG SOL W/FOAM PAD STRL (MISCELLANEOUS) ×1 IMPLANT
CANISTER SUCT 1200ML W/VALVE (MISCELLANEOUS) ×1 IMPLANT
COAG SUCTION FOOTSWITCH 10FR (SUCTIONS) ×2 IMPLANT
DRESSING NASL FOAM PST OP SINU (MISCELLANEOUS) IMPLANT
DRSG NASAL FOAM POST OP SINU (MISCELLANEOUS) ×1 IMPLANT
ELECT REM PT RETURN 9FT ADLT (ELECTROSURGICAL) ×1 IMPLANT
ELECTRODE REM PT RTRN 9FT ADLT (ELECTROSURGICAL) ×1 IMPLANT
GLOVE SURG GAMMEX PI TX LF 7.5 (GLOVE) ×2 IMPLANT
GOWN STRL REUS W/ TWL LRG LVL3 (GOWN DISPOSABLE) ×1 IMPLANT
KIT TURNOVER KIT A (KITS) ×1 IMPLANT
NDL HYPO 25GX1X1/2 BEV (NEEDLE) ×1 IMPLANT
NEEDLE HYPO 25GX1X1/2 BEV (NEEDLE) ×1 IMPLANT
NS IRRIG 500ML POUR BTL (IV SOLUTION) ×1 IMPLANT
PACK ENT CUSTOM (PACKS) ×1 IMPLANT
PATTIES SURGICAL .5 X3 (DISPOSABLE) ×1 IMPLANT
SOLUTION ANTFG W/FOAM PAD STRL (MISCELLANEOUS) ×1 IMPLANT
SPLINT NASAL SEPTAL BLV .50 ST (MISCELLANEOUS) ×1 IMPLANT
SUT CHROMIC 4 0 RB 1X27 (SUTURE) ×1 IMPLANT
SUT ETHILON 3-0 FS-10 30 BLK (SUTURE) ×1 IMPLANT
SUTURE EHLN 3-0 FS-10 30 BLK (SUTURE) ×1 IMPLANT
SYR 10ML LL (SYRINGE) ×1 IMPLANT
TOWEL OR 17X26 4PK STRL BLUE (TOWEL DISPOSABLE) ×1 IMPLANT

## 2023-07-09 NOTE — Transfer of Care (Signed)
 Immediate Anesthesia Transfer of Care Note  Patient: Jaclyn Reyes  Procedure(s) Performed: SEPTOPLASTY (Bilateral) TURBINATE REDUCTION (Bilateral)  Patient Location: PACU  Anesthesia Type: General ETT  Level of Consciousness: awake, alert  and patient cooperative  Airway and Oxygen Therapy: Patient Spontanous Breathing and Patient connected to supplemental oxygen  Post-op Assessment: Post-op Vital signs reviewed, Patient's Cardiovascular Status Stable, Respiratory Function Stable, Patent Airway and No signs of Nausea or vomiting  Post-op Vital Signs: Reviewed and stable  Complications: No notable events documented.

## 2023-07-09 NOTE — Anesthesia Postprocedure Evaluation (Signed)
 Anesthesia Post Note  Patient: Jaclyn Reyes  Procedure(s) Performed: SEPTOPLASTY (Bilateral) TURBINATE REDUCTION (Bilateral)  Patient location during evaluation: PACU Anesthesia Type: General Level of consciousness: awake and alert Pain management: pain level controlled Vital Signs Assessment: post-procedure vital signs reviewed and stable Respiratory status: spontaneous breathing, nonlabored ventilation, respiratory function stable and patient connected to nasal cannula oxygen Cardiovascular status: blood pressure returned to baseline and stable Postop Assessment: no apparent nausea or vomiting Anesthetic complications: no   No notable events documented.   Last Vitals:  Vitals:   07/09/23 1115 07/09/23 1130  BP: 122/78 113/75  Pulse: 65 73  Resp: 15 15  Temp:  36.7 C  SpO2: 100% 99%    Last Pain:  Vitals:   07/09/23 1100  TempSrc:   PainSc: Asleep                 Charron Coultas C Khylen Riolo

## 2023-07-09 NOTE — Anesthesia Procedure Notes (Addendum)
 Procedure Name: Intubation Date/Time: 07/09/2023 9:56 AM  Performed by: Andee Poles, CRNAPre-anesthesia Checklist: Patient identified, Emergency Drugs available, Suction available, Patient being monitored and Timeout performed Patient Re-evaluated:Patient Re-evaluated prior to induction Oxygen Delivery Method: Circle system utilized Preoxygenation: Pre-oxygenation with 100% oxygen Induction Type: IV induction Ventilation: Mask ventilation without difficulty Laryngoscope Size: Mac and 3 Grade View: Grade I Tube type: Oral Rae Tube size: 7.0 mm Number of attempts: 1 Placement Confirmation: ETT inserted through vocal cords under direct vision, positive ETCO2 and breath sounds checked- equal and bilateral Tube secured with: Tape Dental Injury: Teeth and Oropharynx as per pre-operative assessment

## 2023-07-09 NOTE — Op Note (Signed)
 ..07/09/2023  10:54 AM    Jaclyn Reyes  [id number]    Pre-Op Dx:  Deviated Nasal Septum, Hypertrophic Inferior Turbinates  Post-op Dx: Same  Proc: Nasal Septoplasty, Bilateral Partial Reduction Inferior Turbinates   Surg:  Kathyleen Radice  Anes:  GOT  EBL:  15ml  Comp:  none  Findings: Right sided cartilagenous and bone septal deviation.  Bilateral severe inferior turbinate hypertrophy of bone and soft tissue.  Procedure: With the patient in a comfortable supine position,  general orotracheal anesthesia was induced without difficulty.  The patient received preoperative Afrin spray for topical decongestion and vasoconstriction.  At an appropriate level, the patient was placed in a semi-sitting position.  Nasal vibrissae were trimmed.   1% Xylocaine with 1:100,000 epinephrine, 8 cc's, was infiltrated into the anterior floor of the nose, into the nasal spine region, into the membranous columella, and finally into the submucoperichondrial plane of the septum on both sides.  Several minutes were allowed for this to take effect.  Cottoniod pledgetts soaked in Afrin were placed into both nasal cavities and left while the patient was prepped and draped in the standard fashion.   A proper time-out was performed.    The materials were removed from the nose and observed to be intact and correct in number.  The nose was inspected with a headlight and zero degree endoscope with the findings as described above.  A left Killian incision was sharply executed and carried down to the caudal edge of the quadrangular cartilage with a 15 blade scapel.  A mucoperichondrial flap was elelvated along the quadrangular plate back to the bony-cartilaginous junction using caudal elevator and freer elevator. The mucoperiostium was then elevated along the ethmoid plate and the vomer. An itracartilagenous incision was made using the freer elevator and a contralateral mucoperichondiral flap was elevated  using a freer elevator.  Care was taken to avoid any large rents or opposing rents in the mucoperichondrial flap.  Boney spurs of the vomer and maxillary crest were removed with Takahashi forceps.  The area of cartilagenous deviation was removed with combination of freer elevator and Takahashi forceps creating a widely patent nasal cavity as well as resolution of obstruction from the cartilagenous deviation. The mucosal flaps were placed back into their anatomic position to allow visualization of the airways. The septum now sat in the midline with an improved airway.  A 4-0 Chromic was used to close the Nubieber incision as well.   The inferior turbinates were then inspected.  Under endoscopic visualization, the inferior turbinates were infractured bilaterally with a Therapist, nutritional.  A kelly clamp was attached to the anterior-inferior third of each inferior turbinate for approximately one minute.  Under endoscopic visualization, Tru-cutting forceps were used to remove the anterior-inferior third of each inferior turbinate.  Electrocautery was used to control bleeding in the area. The remaining turbinate was then outfractured to open up the airway further. There was no significant bleeding noted. The right turbinate was then trimmed and outfractured in a similar fashion.  The airways were then visualized and showed open passageways on both sides that were significantly improved compared to before surgery.       There was no signifcant bleeding. Nasal splints were applied to both sides of the septum using Xomed 0.44mm regular sized splints that were trimmed, and then held in position with a 3-0 Nylon through and through suture.  Stamberger sinufoam was placed along the cut edge of the inferior turbinates bilaterally.  The patient was  turned back over to anesthesia, and awakened, extubated, and taken to the PACU in satisfactory condition.  Dispo:   PACU to home  Plan: Ice, elevation, narcotic analgesia,  steroid taper, and prophylactic antibiotics for the duration of indwelling nasal foreign bodies.  We will reevaluate the patient in the office in 6 to 7 days and remove the septal splints.  Return to work in 10 days, strenuous activities in two weeks.   Jaclyn Reyes 07/09/2023 10:54 AM

## 2023-07-09 NOTE — H&P (Signed)
..  History and Physical  reviewed and updated as needed.  Patient seen and examined.

## 2023-07-10 ENCOUNTER — Encounter: Payer: Self-pay | Admitting: Otolaryngology

## 2023-08-25 ENCOUNTER — Telehealth: Payer: Self-pay

## 2023-08-25 NOTE — Telephone Encounter (Signed)
 Copied from CRM [id number]. Topic: Appointments - Transfer of Care >> Aug 25, 2023  1:54 PM Deaijah H wrote: Pt is requesting to transfer FROM: Bascom Bossier Pt is requesting to transfer TO: Bluford Burkitt Reason for requested transfer: Stopped going to previous provider  It is the responsibility of the team the patient would like to transfer to (Dr. Julietta Ogren) to reach out to the patient if for any reason this transfer is not acceptable.

## 2023-09-02 NOTE — Telephone Encounter (Signed)
 I left a voicemail for patient letting her know that we have heard back from both Bascom Bossier, FNP, and Bluford Burkitt, NP, and they are ok with her transfer of care request.  I asked patient to please call us  back to schedule this appointment.  E2C2 - when patient calls back, please schedule a transfer of care appointment for her from Bascom Bossier, FNP, to Bluford Burkitt, NP.

## 2023-09-29 ENCOUNTER — Telehealth: Payer: Self-pay | Admitting: Nurse Practitioner

## 2023-09-29 NOTE — Telephone Encounter (Signed)
 Unable to leave a message for patient to call and reschedule new patient appointment. MyChart message was sent on 09/29/2023 to patient.

## 2023-10-01 ENCOUNTER — Ambulatory Visit: Admitting: Nurse Practitioner

## 2023-10-10 ENCOUNTER — Ambulatory Visit: Admitting: Nurse Practitioner

## 2023-11-26 ENCOUNTER — Telehealth: Payer: Self-pay

## 2023-11-26 NOTE — Telephone Encounter (Signed)
 Patient is a former patient of Dr. Randine McLean-Scocuzza and has a [facility] appointment scheduled in December with Leron Glance, NP.  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 Onesimo on 12/01/2023.  Patient would like to verify that Dr. Onesimo will be able to complete her paperwork for her.  Patient asked that we please give her a call to let her know.  Patient states she does need to have her quantiferon tb test done at this visit as well and wants to be sure this will be ok.

## 2023-11-26 NOTE — Telephone Encounter (Signed)
 Copied from CRM [id number]. Topic: General - Other >> Nov 26, 2023  9:21 AM Jaclyn Reyes wrote: Reason for CRM: Patient is calling because her new patient appointment with Leron Glance was rescheduled to 12/22/2023; however, patient would like to know if there is anyway to expedite the appointment as she is going back to school and needs to have some test done that the school is requesting. She needs the quantiferon tb test.

## 2023-11-26 NOTE — Telephone Encounter (Signed)
 Pt is actually scheduled to see Dr. Abbey on 12/22/2023.

## 2023-11-26 NOTE — Telephone Encounter (Signed)
 Patient was notified and says she will bring the requested paperwork to her appointment with Dr Onesimo on Monday July 21st. Verbalized understanding and has no further questions at this time.

## 2023-11-27 NOTE — Telephone Encounter (Signed)
 FYI

## 2023-11-27 NOTE — Telephone Encounter (Signed)
 I spoke with patient and relayed message from Dr. Narendra.  Patient states she is ok to have her labs drawn at her visit with Dr. Narendra.  Patient asked that we please call her mom, Comer [place] [phone]) when papers are ready to be picked up and she will come by and get them, as patient will be out of town at that time.

## 2023-12-01 ENCOUNTER — Encounter: Payer: Self-pay | Admitting: Internal Medicine

## 2023-12-01 ENCOUNTER — Ambulatory Visit (INDEPENDENT_AMBULATORY_CARE_PROVIDER_SITE_OTHER): Admitting: Internal Medicine

## 2023-12-01 VITALS — BP 116/80 | HR 63 | Temp 98.1°F | Ht 65.0 in | Wt 197.0 lb

## 2023-12-01 DIAGNOSIS — Z23 Encounter for immunization: Secondary | ICD-10-CM | POA: Diagnosis not present

## 2023-12-01 DIAGNOSIS — Z0000 Encounter for general adult medical examination without abnormal findings: Secondary | ICD-10-CM

## 2023-12-01 DIAGNOSIS — Z0289 Encounter for other administrative examinations: Secondary | ICD-10-CM | POA: Insufficient documentation

## 2023-12-01 LAB — CBC WITH DIFFERENTIAL/PLATELET
Basophils Absolute: 0 K/uL (ref 0.0–0.1)
Basophils Relative: 0.7 % (ref 0.0–3.0)
Eosinophils Absolute: 0.1 K/uL (ref 0.0–0.7)
Eosinophils Relative: 1.4 % (ref 0.0–5.0)
HCT: 41.1 % (ref 36.0–46.0)
Hemoglobin: 13.7 g/dL (ref 12.0–15.0)
Lymphocytes Relative: 24.8 % (ref 12.0–46.0)
Lymphs Abs: 1.7 K/uL (ref 0.7–4.0)
MCHC: 33.4 g/dL (ref 30.0–36.0)
MCV: 83.2 fl (ref 78.0–100.0)
Monocytes Absolute: 0.4 K/uL (ref 0.1–1.0)
Monocytes Relative: 5.8 % (ref 3.0–12.0)
Neutro Abs: 4.5 K/uL (ref 1.4–7.7)
Neutrophils Relative %: 67.3 % (ref 43.0–77.0)
Platelets: 314 K/uL (ref 150.0–400.0)
RBC: 4.94 Mil/uL (ref 3.87–5.11)
RDW: 13 % (ref 11.5–15.5)
WBC: 6.7 K/uL (ref 4.0–10.5)

## 2023-12-01 LAB — BASIC METABOLIC PANEL WITH GFR
BUN: 10 mg/dL (ref 6–23)
CO2: 27 meq/L (ref 19–32)
Calcium: 9.5 mg/dL (ref 8.4–10.5)
Chloride: 101 meq/L (ref 96–112)
Creatinine, Ser: 0.72 mg/dL (ref 0.40–1.20)
GFR: 118.41 mL/min (ref >60.00)
Glucose, Bld: 82 mg/dL (ref 70–99)
Potassium: 4.4 meq/L (ref 3.5–5.1)
Sodium: 137 meq/L (ref 135–145)

## 2023-12-01 NOTE — Patient Instructions (Signed)
-   It was a pleasure meeting you today -We will obtain your QuantiFERON test as well as your HIV screen and hepatitis C screen today -Will also check your blood counts and your kidney function -Will obtain your vaccination records from   immunization records -You may need hepatitis B vaccination if you have not gotten this already as well as a Tdap booster -I have asked our office to contact your gynecologist to see if they did a chlamydia screen recently -Please contact us  with any questions or concerns or if you need any refills

## 2023-12-01 NOTE — Addendum Note (Signed)
 Addended by: SEBASTIAN NORRIS A on: 12/01/2023 10:48 AM   Modules accepted: Orders

## 2023-12-01 NOTE — Progress Notes (Signed)
 New Patient Office Visit  Subjective    Patient ID: Jaclyn Reyes, female    DOB: January 01, 2001  Age: 23 y.o. MRN: [id number]  CC:  Chief Complaint  Patient presents with   Annual Exam    Quantiferon TB Test    HPI Jaclyn Reyes presents to establish care Patient was previously seen in our office in December 2022 by Dr. Rolan.  She presents today to reestablish care.  She states that she feels well and denies any complaints currently.  She is applying to PA school and requires paperwork to be filled prior to enrolling in this program.  Patient states that she has a normal appetite and sleeps well.  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  She used to play soccer in school and is getting back into this as well.  She has good social support and has good interpersonal relationships.  She is due for a Tdap today but is up-to-date otherwise with her vaccinations.   Outpatient Encounter Medications as of 12/01/2023  Medication Sig   Multiple Vitamin (MULTIVITAMIN) tablet Take 1 tablet by mouth daily.   norgestimate -ethinyl estradiol  (ORTHO-CYCLEN) 0.25-35 MG-MCG tablet TAKE 1 TABLET BY MOUTH EVERY DAY(INS REQUIRES 90 DAYS)   ondansetron  (ZOFRAN ) 4 MG tablet Take 1 tablet (4 mg total) by mouth every 8 (eight) hours as needed for nausea or vomiting.   oxyCODONE -acetaminophen  (PERCOCET) 5-325 MG tablet Take 1 tablet by mouth every 4 (four) hours as needed for severe pain (pain score 7-10).   No facility-administered encounter medications on file as of 12/01/2023.    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   Shin splints     Past Surgical History:  Procedure Laterality Date   gum grafting     04/25/21   SEPTOPLASTY Bilateral 07/09/2023   Procedure: SEPTOPLASTY;  Surgeon: Milissa Hamming, MD;  Location:  [facility];  Service: ENT;  Laterality: Bilateral;   TURBINATE REDUCTION Bilateral 07/09/2023   Procedure: TURBINATE REDUCTION;  Surgeon: Milissa Hamming, MD;  Location: [facility];  Service: ENT;  Laterality: Bilateral;   TYMPANOSTOMY TUBE PLACEMENT      Family History  Problem Relation Age of Onset   Healthy Mother    Healthy Father     Social History   Socioeconomic History   Marital status: Single    Spouse name: Not on file   Number of children: Not on file   Years of education: Not on file   Highest education level: Not on file  Occupational History   Not on file  Tobacco Use   Smoking status: Never   Smokeless tobacco: Never  Vaping Use   Vaping status: Never Used  Substance and Sexual Activity   Alcohol use: Not Currently   Drug use: Not Currently   Sexual activity: Yes    Birth control/protection: Condom, Pill  Other Topics Concern   Not on file  Social History Narrative   Fall semester [employer] year [employer]    Social Drivers of Health   Financial Resource Strain: Not on file  Food Insecurity: Not on file  Transportation Needs: Not on file  Physical Activity: Not on file  Stress: Not on file  Social Connections: Not on  file  Intimate Partner Violence: Not on file    Review of Systems  Constitutional: Negative.   HENT: Negative.    Respiratory: Negative.    Cardiovascular: Negative.   Gastrointestinal: Negative.   Musculoskeletal: Negative.   Neurological: Negative.   Psychiatric/Behavioral: Negative.          Objective    BP 116/80   Pulse 63   Temp 98.1 F (36.7 C)   Ht 5' 5 (1.651 m)   Wt 197 lb (89.4 kg)   LMP 11/20/2023   SpO2 99%   BMI 32.78 kg/m   Physical Exam Constitutional:      Appearance: Normal appearance.  HENT:     Head: Normocephalic and atraumatic.  Cardiovascular:     Rate and Rhythm: Normal rate and regular rhythm.     Heart sounds: Normal heart sounds.  Pulmonary:      Breath sounds: Normal breath sounds. No wheezing or rales.  Abdominal:     General: Bowel sounds are normal. There is no distension.     Tenderness: There is no abdominal tenderness.  Musculoskeletal:        General: No swelling or tenderness.  Neurological:     General: No focal deficit present.     Mental Status: She is alert and oriented to person, place, and time.  Psychiatric:        Mood and Affect: Mood normal.        Behavior: Behavior normal.         Assessment & Plan:   Problem List Items Addressed This Visit       Other   Annual physical exam - Primary   - Patient presents today to reestablish care and to get her paperwork prior to her enrollment in PA school -She denies any complaints at this time -Physical exam was unremarkable -She is due for hepatitis C and HIV screening.  We will get that done for her today -She is up-to-date on her vaccinations except for Tdap.  She will get her Tdap vaccination today -She will also get a QuantiFERON test as part of her screening prior to enrollment in preschool -She follows with a gynecologist and had a pelvic exam done in January.  Will obtain the results from them.  If she has not had a chlamydia screening done  this year she can get this at her follow-up with her PCP in December -Patient is on Ortho-Cyclen for contraception and receives this from her GYN office. -Will also check a BMP and a CBC today as part of routine annual physical exam -No further workup at this time      Relevant Orders   QuantiFERON-TB Gold Plus   HIV antibody (with reflex)   Hepatitis C Antibody   CBC with Differential/Platelet   Basic metabolic panel with GFR    No follow-ups on file.   Keeli Roberg, MD

## 2023-12-01 NOTE — Assessment & Plan Note (Addendum)
-   Patient presents today to reestablish care and to get her paperwork prior to her enrollment in [employer] school -She denies any complaints at this time -Physical exam was unremarkable -She is due for hepatitis C and HIV screening.  We will get that done for her today -She is up-to-date on her vaccinations except for Tdap.  She will get her Tdap vaccination today -She will also get a QuantiFERON test as part of her screening prior to enrollment in preschool -She follows with a gynecologist and had a pelvic exam done in January.  Will obtain the results from them.  If she has not had a chlamydia screening done  this year she can get this at her follow-up with her PCP in December -Patient is on Ortho-Cyclen for contraception and receives this from her GYN office. -Will also check a BMP and a CBC today as part of routine annual physical exam -No further workup at this time

## 2023-12-02 ENCOUNTER — Ambulatory Visit: Payer: Self-pay | Admitting: Internal Medicine

## 2023-12-03 LAB — HIV ANTIBODY (ROUTINE TESTING W REFLEX): HIV 1&2 Ab, 4th Generation: NONREACTIVE

## 2023-12-03 LAB — HEPATITIS C ANTIBODY: Hepatitis C Ab: NONREACTIVE

## 2023-12-03 LAB — QUANTIFERON-TB GOLD PLUS
Mitogen-NIL: 9.27 [IU]/mL
NIL: 0.01 [IU]/mL
QuantiFERON-TB Gold Plus: NEGATIVE
TB1-NIL: 0 [IU]/mL
TB2-NIL: 0 [IU]/mL

## 2023-12-10 ENCOUNTER — Telehealth: Payer: Self-pay

## 2023-12-10 NOTE — Telephone Encounter (Signed)
 Spoke to Patient's father per Patient and he stated that him and his wife will be here to pick up her paperwork for college in the next 2 days. Patient is on vacation.

## 2023-12-19 ENCOUNTER — Ambulatory Visit: Admitting: Nurse Practitioner

## 2023-12-22 ENCOUNTER — Ambulatory Visit

## 2024-03-31 NOTE — Telephone Encounter (Signed)
 open in error

## 2024-04-28 ENCOUNTER — Encounter: Admitting: Nurse Practitioner

## 2024-05-06 ENCOUNTER — Other Ambulatory Visit: Payer: Self-pay | Admitting: Obstetrics & Gynecology

## 2024-05-06 DIAGNOSIS — Z3041 Encounter for surveillance of contraceptive pills: Secondary | ICD-10-CM
